# Patient Record
Sex: Male | Born: 1965 | Race: White | Hispanic: No | Marital: Married | Smoking: Never smoker
Health system: Southern US, Community
[De-identification: ages and names within clinical notes are randomized; demographics above are authoritative.]

## PROBLEM LIST (undated history)

## (undated) DIAGNOSIS — K5792 Diverticulitis of intestine, part unspecified, without perforation or abscess without bleeding: Secondary | ICD-10-CM

## (undated) HISTORY — DX: Diverticulitis of intestine, part unspecified, without perforation or abscess without bleeding: K57.92

---

## 2000-04-11 ENCOUNTER — Emergency Department (HOSPITAL_COMMUNITY): Admission: EM | Admit: 2000-04-11 | Discharge: 2000-04-11 | Payer: Self-pay | Admitting: Emergency Medicine

## 2007-04-15 ENCOUNTER — Encounter (HOSPITAL_COMMUNITY): Admission: RE | Admit: 2007-04-15 | Discharge: 2007-06-29 | Payer: Self-pay | Admitting: Endocrinology

## 2011-02-28 ENCOUNTER — Ambulatory Visit (INDEPENDENT_AMBULATORY_CARE_PROVIDER_SITE_OTHER): Payer: BC Managed Care – PPO

## 2011-02-28 DIAGNOSIS — J069 Acute upper respiratory infection, unspecified: Secondary | ICD-10-CM

## 2011-07-02 ENCOUNTER — Ambulatory Visit (INDEPENDENT_AMBULATORY_CARE_PROVIDER_SITE_OTHER): Payer: BC Managed Care – PPO | Admitting: Internal Medicine

## 2011-07-02 DIAGNOSIS — H9209 Otalgia, unspecified ear: Secondary | ICD-10-CM

## 2011-07-02 DIAGNOSIS — J329 Chronic sinusitis, unspecified: Secondary | ICD-10-CM

## 2011-07-02 MED ORDER — AMOXICILLIN 500 MG PO CAPS: 1000.0000 mg | ORAL_CAPSULE | Freq: Two times a day (BID) | ORAL | Status: AC

## 2011-07-02 MED ORDER — MELOXICAM 15 MG PO TABS: 15.0000 mg | ORAL_TABLET | Freq: Every day | ORAL | Status: DC

## 2011-07-02 MED ORDER — CIPROFLOXACIN-HYDROCORTISONE 0.2-1 % OT SUSP: 3.0000 [drp] | Freq: Two times a day (BID) | OTIC | Status: AC

## 2011-07-02 NOTE — Progress Notes (Signed)
  Subjective:    Patient ID: Joe Grant, male    DOB: [birth year], 46 y.o.   MRN: [id number]  HPIOtalgia for 5 days with no change in hearing Sinus congestion for 3 weeks with increased allergy symptoms No fever cough  Son is out of trouble and doing better but his wife has developed fibromyalgia following a severe auto accident 2 years ago and is not doing well at home  Review of Systems     Objective:   Physical Exam Left canal with pustule TMs intact Nares boggy appearance Tender maxillary areas to percussion No regional adenopathy       Assessment & Plan:  Problem #1 allergic rhinitis Problem #2 sinusitis Problem #3 otalgia secondary to otitis externa Meds ordered this encounter  Medications  . amoxicillin (AMOXIL) 500 MG capsule    Sig: Take 2 capsules (1,000 mg total) by mouth 2 (two) times daily.    Dispense:  40 capsule    Refill:  0  . meloxicam (MOBIC) 15 MG tablet    Sig: Take 1 tablet (15 mg total) by mouth daily. For pain    Dispense:  30 tablet    Refill:  0  . ciprofloxacin-hydrocortisone (CIPRO HC) otic suspension    Sig: Place 3 drops into the left ear 2 (two) times daily.    Dispense:  10 mL    Refill:  0   Check in 2 weeks if not well sooner if worse

## 2011-07-15 ENCOUNTER — Ambulatory Visit (INDEPENDENT_AMBULATORY_CARE_PROVIDER_SITE_OTHER): Payer: BC Managed Care – PPO | Admitting: Physician Assistant

## 2011-07-15 VITALS — BP 122/74 | HR 71 | Temp 98.1°F | Resp 16 | Ht 68.5 in | Wt 174.2 lb

## 2011-07-15 DIAGNOSIS — H6980 Other specified disorders of Eustachian tube, unspecified ear: Secondary | ICD-10-CM

## 2011-07-15 DIAGNOSIS — R51 Headache: Secondary | ICD-10-CM

## 2011-07-15 DIAGNOSIS — H9209 Otalgia, unspecified ear: Secondary | ICD-10-CM

## 2011-07-15 DIAGNOSIS — Z131 Encounter for screening for diabetes mellitus: Secondary | ICD-10-CM

## 2011-07-15 LAB — GLUCOSE, POCT (MANUAL RESULT ENTRY): POC Glucose: 107 mg/dl — AB (ref 70–99)

## 2011-07-15 MED ORDER — METHYLPREDNISOLONE ACETATE 80 MG/ML IJ SUSP
80.0000 mg | Freq: Once | INTRAMUSCULAR | Status: AC
  Administered 2011-07-15: 80 mg via INTRAMUSCULAR

## 2011-07-15 MED ORDER — NAPROXEN 500 MG PO TABS: ORAL_TABLET | ORAL | Status: DC

## 2011-07-15 MED ORDER — FLUTICASONE PROPIONATE 50 MCG/ACT NA SUSP: 2.0000 | Freq: Every day | NASAL | Status: DC

## 2011-07-15 NOTE — Progress Notes (Signed)
  Subjective:    Patient ID: Joe Grant, male    DOB: 1965/11/25, 46 y.o.   MRN: [id number]  HPI See last OV.  F/up s/p ear infection.  Didn't get ear drops, only took oral antibiotic.  Mobic only lasted about 1 hour for the headaches he is having.   His L ear continues to throb and be uncomfortable with reduced hearing. No f/c.  Review of Systems  All other systems reviewed and are negative.       Objective:   Physical Exam  Nursing note and vitals reviewed. Constitutional: He is oriented to person, place, and time. He appears well-developed and well-nourished.  HENT:  Head: Normocephalic and atraumatic.  Right Ear: External ear normal.  Left Ear: External ear normal.  Mouth/Throat: No oropharyngeal exudate (throat w/ PND).       L TM bulging w/o infection.  Scarring present at inferior aspect of TM.  Pinna non-tender w/ manipulation, no erythema in canal.  Turbinates pale and enlarged.  Neck: Normal range of motion. Neck supple.  Cardiovascular: Normal rate, regular rhythm and normal heart sounds.   Pulmonary/Chest: Effort normal and breath sounds normal.  Neurological: He is alert and oriented to person, place, and time.   Results for orders placed in visit on 07/15/11  GLUCOSE, POCT (MANUAL RESULT ENTRY)      Component Value Range   POC Glucose 107 (*) 70 - 99 (mg/dl)          Assessment & Plan:  Headache-secondary to sinus inflammation Eustachian tube dysfunction 80 IM Depo medrol now.  Start OTC sudafed. D/c mobic.  Try naprosyn and start flonase. Patient will call by the end of the week if not improving and will refer to ENT.

## 2011-07-15 NOTE — Patient Instructions (Signed)
Call by end of week if not improving at all and we will refer to ENT.

## 2011-07-23 ENCOUNTER — Ambulatory Visit (INDEPENDENT_AMBULATORY_CARE_PROVIDER_SITE_OTHER): Payer: BC Managed Care – PPO | Admitting: Family Medicine

## 2011-07-23 VITALS — BP 121/75 | HR 80 | Temp 97.9°F | Resp 16 | Ht 68.5 in | Wt 168.6 lb

## 2011-07-23 DIAGNOSIS — H9209 Otalgia, unspecified ear: Secondary | ICD-10-CM

## 2011-07-23 LAB — POCT CBC
Granulocyte percent: 55.7 %G (ref 37–80)
HCT, POC: 44.4 % (ref 43.5–53.7)
MCV: 95.1 fL (ref 80–97)
MID (cbc): 0.6 (ref 0–0.9)
Platelet Count, POC: 223 10*3/uL (ref 142–424)
RBC: 4.67 M/uL — AB (ref 4.69–6.13)

## 2011-07-23 MED ORDER — PREDNISONE 20 MG PO TABS: ORAL_TABLET | ORAL | Status: AC

## 2011-07-23 NOTE — Progress Notes (Signed)
Subjective: Painful ear continues to bother him. Improved a little for a day or 2 after the steroid shot but it has gotten worse. It continues to bother him quite a lot. Feels like he hurts into the left side of the face. He feels like it's swollen jaw.  Objective: TM looks normal ear canal looks normal there is a little old scarring at the base of the left TM but no active infection. The TMJ is not particularly tender. The pinna does not hurt on movement..  Assessment: Otalgia  Plan: Refer to ENT  Specialist Prednisone taper

## 2011-07-23 NOTE — Patient Instructions (Signed)
Take medicine as ordered  We are referring you to the Ear Specialist

## 2011-07-24 ENCOUNTER — Emergency Department (HOSPITAL_COMMUNITY)
Admission: EM | Admit: 2011-07-24 | Discharge: 2011-07-24 | Disposition: A | Discharge status: Home / Self Care | Payer: BC Managed Care – PPO | Attending: Emergency Medicine | Admitting: Emergency Medicine

## 2011-07-24 ENCOUNTER — Encounter (HOSPITAL_COMMUNITY): Payer: Self-pay | Admitting: *Deleted

## 2011-07-24 DIAGNOSIS — R51 Headache: Secondary | ICD-10-CM | POA: Insufficient documentation

## 2011-07-24 DIAGNOSIS — H9209 Otalgia, unspecified ear: Secondary | ICD-10-CM

## 2011-07-24 DIAGNOSIS — R519 Headache, unspecified: Secondary | ICD-10-CM

## 2011-07-24 MED ORDER — HYDROCODONE-ACETAMINOPHEN 5-500 MG PO TABS: 1.0000 | ORAL_TABLET | Freq: Four times a day (QID) | ORAL | Status: AC | PRN

## 2011-07-24 MED ORDER — GABAPENTIN 300 MG PO CAPS: ORAL_CAPSULE | ORAL | Status: DC

## 2011-07-24 MED ORDER — HYDROCODONE-ACETAMINOPHEN 5-325 MG PO TABS
2.0000 | ORAL_TABLET | Freq: Once | ORAL | Status: AC
  Administered 2011-07-24: 1 via ORAL
  Filled 2011-07-24: qty 2

## 2011-07-24 NOTE — Discharge Instructions (Signed)
Start neurontin as directed. Use hydrocodone-acetaminophen as needed for pain but do not drive or operate machinery with hydrocodone-acetaminophen use. Follow up with ENT is VERY important for further evaluation and management of ongoing pain but return to ER for emergent changing or worsening of symptoms.

## 2011-07-24 NOTE — ED Notes (Signed)
Pt c/o left ear infection x 1 month; finished antibiotics/steroid inj/steroid pills.  States pain severe; MD is referring him to ENT

## 2011-07-24 NOTE — ED Provider Notes (Signed)
History     CSN: [id number]  Arrival date & time 07/24/11  0555   First MD Initiated Contact with Patient 07/24/11 [phone]      Chief Complaint  Patient presents with  . Otalgia    (Consider location/radiation/quality/duration/timing/severity/associated sxs/prior treatment) HPI  Patient presents to ER complaining of a one month hx of left ear pain with radiation of pain into left temple and jaw that he states he has been seeing his PCP Dr. Alwyn Ren for stating that over the last month has been on abx and steriods stating that initially he was told he had an ear infection. Patient states that despite abx, steroids and 600mg  of ibuprofen he is having ongoing severe but unchanging pain. PCP made appt with ENT for the near future. He denies aggravating or alleviating factors. Patient denies fever, chills, dizziness, visual changes, neck pain, dental pain, sore throat, neck stiffness.  History reviewed. No pertinent past medical history.  History reviewed. No pertinent past surgical history.  History reviewed. No pertinent family history.  History  Substance Use Topics  . Smoking status: Never Smoker   . Smokeless tobacco: Not on file  . Alcohol Use: Not on file      Review of Systems  All other systems reviewed and are negative.    Allergies  Review of patient's allergies indicates no known allergies.  Home Medications   Current Outpatient Rx  Name Route Sig Dispense Refill  . FLUTICASONE PROPIONATE 50 MCG/ACT NA SUSP Nasal Place 2 sprays into the nose daily. 16 g 6  . NAPROXEN 500 MG PO TABS  1 tab bid w/ food X 5 days then prn pain or headache 60 tablet 0  . PREDNISONE 20 MG PO TABS  Take 3 daily for 2 days, 2 daily for 2 day, 1 daily for 2 days, 1/2 daily for 2 days 13 tablet 0    BP 145/85  Pulse 92  Temp 98.3 F (36.8 C) (Oral)  Resp 20  Wt 168 lb (76.204 kg)  SpO2 97%  Physical Exam  Vitals reviewed. Constitutional: He is oriented to person, place, and time.  He appears well-developed and well-nourished. No distress.  HENT:  Head: Normocephalic and atraumatic.  Right Ear: External ear normal.  Left Ear: External ear normal.  Nose: Nose normal.  Mouth/Throat: No oropharyngeal exudate.       Mild erythema of posterior pharynx and tonsils no tonsillar exudate or enlargement. Patent airway. Swallowing secretions well  Mild TTP of left TMJ but no TTP of post auricular region with no abnormalities of external ears bilaterally.   No temporal artery TTP and no ropiness.   Eyes: Conjunctivae and EOM are normal. Pupils are equal, round, and reactive to light.  Neck: Normal range of motion. Neck supple.  Cardiovascular: Normal rate, regular rhythm and normal heart sounds.  Exam reveals no gallop and no friction rub.   No murmur heard. Pulmonary/Chest: Effort normal and breath sounds normal. No respiratory distress. He has no wheezes. He has no rales. He exhibits no tenderness.  Lymphadenopathy:    He has no cervical adenopathy.  Neurological: He is alert and oriented to person, place, and time. He has normal reflexes. No cranial nerve deficit. Coordination normal.  Skin: Skin is warm and dry. No rash noted. He is not diaphoretic.  Psychiatric: He has a normal mood and affect.    ED Course  Procedures (including critical care time)  PO norco  Labs Reviewed - No data to display No  results found.   1. Earache   2. Facial pain       MDM  Normal ear exam of internal and external ear. No worrisome signs or symptoms for temporal arteritis with patient's age making such dx unlikely. No neuro focal findings with a one month hx of unchanging pain and patient afebrile and non toxic appearing. Question possible neuralgia such as trigeminal neuralgia causing pain into left ear and face. Will start neurontin with norco for pain but patient has established f/u with ENT.       [place], [place] 07/24/11 0730

## 2011-07-24 NOTE — ED Provider Notes (Signed)
Medical screening examination/treatment/procedure(s) were performed by non-physician practitioner and as supervising physician I was immediately available for consultation/collaboration.   Celene Kras, MD 07/24/11 [phone]

## 2011-07-31 ENCOUNTER — Telehealth: Payer: Self-pay

## 2011-07-31 NOTE — Telephone Encounter (Signed)
When did he see the ENT?  Who did he see?  When is his appointment with the dentist?

## 2011-07-31 NOTE — Telephone Encounter (Signed)
Pt went to ent - they referred him to dentist.  He will run out of pain meds soon and would like more until he can get to the dentist.  Best number is [phone]

## 2011-08-01 NOTE — Telephone Encounter (Signed)
Spoke with Joe Grant. He saw the ENT two days ago. He saw Dr. Pollyann Kennedy and his appt was today so got pain meds from his doctor. He wanted to let Dr Alwyn Ren know he was diagnosed with TMJ

## 2011-08-02 NOTE — Telephone Encounter (Signed)
Noted  

## 2011-08-12 ENCOUNTER — Telehealth: Payer: Self-pay

## 2011-08-12 NOTE — Telephone Encounter (Signed)
Patient is requesting a refill of Vicodin to get him until Monday. This is for jaw pain. He saw a dentist last week and they gave him enough until today, but is seeing an oral surgeon Monday. Please advise, [phone]

## 2011-08-13 NOTE — Telephone Encounter (Signed)
Explained to pt that he should speak w/his dentist who Rxd his pain med through today and ask for him to extend it until his appt w/oral surgeon. Pt agreed.

## 2011-08-13 NOTE — Telephone Encounter (Signed)
Needs to call dentist

## 2011-09-24 ENCOUNTER — Encounter: Payer: Self-pay | Admitting: Family Medicine

## 2012-08-16 ENCOUNTER — Ambulatory Visit (INDEPENDENT_AMBULATORY_CARE_PROVIDER_SITE_OTHER): Payer: BC Managed Care – PPO | Admitting: Family Medicine

## 2012-08-16 VITALS — BP 122/78 | HR 92 | Temp 97.5°F | Resp 18 | Ht 70.0 in | Wt 181.0 lb

## 2012-08-16 DIAGNOSIS — K5732 Diverticulitis of large intestine without perforation or abscess without bleeding: Secondary | ICD-10-CM

## 2012-08-16 MED ORDER — METRONIDAZOLE 500 MG PO TABS: 500.0000 mg | ORAL_TABLET | Freq: Two times a day (BID) | ORAL | Status: DC

## 2012-08-16 MED ORDER — CIPROFLOXACIN HCL 500 MG PO TABS: 500.0000 mg | ORAL_TABLET | Freq: Two times a day (BID) | ORAL | Status: DC

## 2012-08-16 NOTE — Progress Notes (Signed)
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  He presents with LLQ pain x 24 hours.    Significant negs:  No nausea, diarrhea, constipation, fever  Objective:  NAD HEENT:  Unremarkable Neck: supple, mild soft tissue fullness over thyroid area Heart:  I/VI SEM, regular Chest:  Clear Abdomen:  Soft, no HSM, some guarding with LLQ tenderness, no rebound, no mass Skin:  No rash  Assessment:  Diverticulitis.  Plan:   Diverticulitis of colon without hemorrhage - Plan: ciprofloxacin (CIPRO) 500 MG tablet, metroNIDAZOLE (FLAGYL) 500 MG tablet  Signed, Elvina Sidle, MD

## 2012-08-16 NOTE — Patient Instructions (Addendum)
Diverticulitis °A diverticulum is a small pouch or sac on the colon. Diverticulosis is the presence of these diverticula on the colon. Diverticulitis is the irritation (inflammation) or infection of diverticula. °CAUSES  °The colon and its diverticula contain bacteria. If food particles block the tiny opening to a diverticulum, the bacteria inside can grow and cause an increase in pressure. This leads to infection and inflammation and is called diverticulitis. °SYMPTOMS  °· Abdominal pain and tenderness. Usually, the pain is located on the left side of your abdomen. However, it could be located elsewhere. °· Fever. °· Bloating. °· Feeling sick to your stomach (nausea). °· Throwing up (vomiting). °· Abnormal stools. °DIAGNOSIS  °Your caregiver will take a history and perform a physical exam. Since many things can cause abdominal pain, other tests may be necessary. Tests may include: °· Blood tests. °· Urine tests. °· X-ray of the abdomen. °· CT scan of the abdomen. °Sometimes, surgery is needed to determine if diverticulitis or other conditions are causing your symptoms. °TREATMENT  °Most of the time, you can be treated without surgery. Treatment includes: °· Resting the bowels by only having liquids for a few days. As you improve, you will need to eat a low-fiber diet. °· Intravenous (IV) fluids if you are losing body fluids (dehydrated). °· Antibiotic medicines that treat infections may be given. °· Pain and nausea medicine, if needed. °· Surgery if the inflamed diverticulum has burst. °HOME CARE INSTRUCTIONS  °· Try a clear liquid diet (broth, tea, or water for as long as directed by your caregiver). You may then gradually begin a low-fiber diet as tolerated.  °A low-fiber diet is a diet with less than 10 grams of fiber. Choose the foods below to reduce fiber in the diet: °· White breads, cereals, rice, and pasta. °· Cooked fruits and vegetables or soft fresh fruits and vegetables without the skin. °· Ground or  well-cooked tender beef, ham, veal, lamb, pork, or poultry. °· Eggs and seafood. °· After your diverticulitis symptoms have improved, your caregiver may put you on a high-fiber diet. A high-fiber diet includes 14 grams of fiber for every 1000 calories consumed. For a standard 2000 calorie diet, you would need 28 grams of fiber. Follow these diet guidelines to help you increase the fiber in your diet. It is important to slowly increase the amount fiber in your diet to avoid gas, constipation, and bloating. °· Choose whole-grain breads, cereals, pasta, and brown rice. °· Choose fresh fruits and vegetables with the skin on. Do not overcook vegetables because the more vegetables are cooked, the more fiber is lost. °· Choose more nuts, seeds, legumes, dried peas, beans, and lentils. °· Look for food products that have greater than 3 grams of fiber per serving on the Nutrition Facts label. °· Take all medicine as directed by your caregiver. °· If your caregiver has given you a follow-up appointment, it is very important that you go. Not going could result in lasting (chronic) or permanent injury, pain, and disability. If there is any problem keeping the appointment, call to reschedule. °SEEK MEDICAL CARE IF:  °· Your pain does not improve. °· You have a hard time advancing your diet beyond clear liquids. °· Your bowel movements do not return to normal. °SEEK IMMEDIATE MEDICAL CARE IF:  °· Your pain becomes worse. °· You have an oral temperature above 102° F (38.9° C), not controlled by medicine. °· You have repeated vomiting. °· You have bloody or black, tarry stools. °·   Symptoms that brought you to your caregiver become worse or are not getting better. °MAKE SURE YOU:  °· Understand these instructions. °· Will watch your condition. °· Will get help right away if you are not doing well or get worse. °Document Released: 11/07/2004 Document Revised: 04/22/2011 Document Reviewed: 03/05/2010 °[employer]® Patient Information  ©2014 [employer]. ° °

## 2012-08-26 ENCOUNTER — Ambulatory Visit (INDEPENDENT_AMBULATORY_CARE_PROVIDER_SITE_OTHER): Payer: BC Managed Care – PPO | Admitting: Family Medicine

## 2012-08-26 VITALS — BP 110/68 | HR 66 | Temp 97.8°F | Resp 16 | Ht 68.0 in | Wt 180.0 lb

## 2012-08-26 DIAGNOSIS — R1032 Left lower quadrant pain: Secondary | ICD-10-CM

## 2012-08-26 LAB — POCT CBC
Granulocyte percent: 40.9 %G (ref 37–80)
HCT, POC: 45.9 % (ref 43.5–53.7)
Hemoglobin: 14.7 g/dL (ref 14.1–18.1)
Lymph, poc: 3 (ref 0.6–3.4)
MCH, POC: 31.4 pg — AB (ref 27–31.2)
MCHC: 32 g/dL (ref 31.8–35.4)
MCV: 98 fL — AB (ref 80–97)
MID (cbc): 0.6 (ref 0–0.9)
MPV: 9.9 fL (ref 0–99.8)
POC Granulocyte: 2.5 (ref 2–6.9)
POC LYMPH PERCENT: 49.5 %L (ref 10–50)
POC MID %: 9.6 %M (ref 0–12)
Platelet Count, POC: 245 10*3/uL (ref 142–424)
RBC: 4.68 M/uL — AB (ref 4.69–6.13)
RDW, POC: 13.4 %
WBC: 6 10*3/uL (ref 4.6–10.2)

## 2012-08-26 NOTE — Progress Notes (Signed)
Patient who works for [employer], developed LLQ pain 11 days ago and took 10 days of antibiotics (Flagyl and Cipro) with only small improvement.  He has also noted some blood in stool. Patient has a grandmother with h/o diverticulitis  Objective:  NAD HEENT:  Unremarkable Skin: normal Abdomen:  Soft, tender LLQ with fullness there, no HSM. Gait:  Normal  Assessment:  Persistent LLQ which  May represent a polyp complicating diverticulosis  Plan: Gastroenterolgy consult LLQ abdominal pain - Plan: Comprehensive metabolic panel, POCT CBC, Ambulatory referral to Gastroenterology  Signed, Elvina Sidle, MD

## 2012-08-27 LAB — COMPREHENSIVE METABOLIC PANEL
ALT: 54 U/L — ABNORMAL HIGH (ref 0–53)
AST: 32 U/L (ref 0–37)
Albumin: 4.1 g/dL (ref 3.5–5.2)
Alkaline Phosphatase: 57 U/L (ref 39–117)
BUN: 18 mg/dL (ref 6–23)
CO2: 28 mEq/L (ref 19–32)
Calcium: 9.3 mg/dL (ref 8.4–10.5)
Chloride: 107 mEq/L (ref 96–112)
Creat: 0.96 mg/dL (ref 0.50–1.35)
Glucose, Bld: 95 mg/dL (ref 70–99)
Potassium: 4.3 mEq/L (ref 3.5–5.3)
Sodium: 140 mEq/L (ref 135–145)
Total Bilirubin: 0.3 mg/dL (ref 0.3–1.2)
Total Protein: 7.1 g/dL (ref 6.0–8.3)

## 2012-09-01 ENCOUNTER — Encounter: Payer: Self-pay | Admitting: Gastroenterology

## 2012-09-03 ENCOUNTER — Other Ambulatory Visit: Payer: Self-pay | Admitting: Gastroenterology

## 2012-09-03 ENCOUNTER — Ambulatory Visit
Admission: RE | Admit: 2012-09-03 | Discharge: 2012-09-03 | Disposition: A | Discharge status: Home / Self Care | Payer: BC Managed Care – PPO | Source: Ambulatory Visit | Attending: Gastroenterology | Admitting: Gastroenterology

## 2012-09-03 DIAGNOSIS — R1032 Left lower quadrant pain: Secondary | ICD-10-CM

## 2012-09-03 MED ORDER — IOHEXOL 300 MG/ML  SOLN
100.0000 mL | Freq: Once | INTRAMUSCULAR | Status: AC | PRN
  Administered 2012-09-03: 100 mL via INTRAVENOUS

## 2012-09-23 ENCOUNTER — Ambulatory Visit: Payer: BC Managed Care – PPO | Admitting: Gastroenterology

## 2013-01-25 ENCOUNTER — Ambulatory Visit (INDEPENDENT_AMBULATORY_CARE_PROVIDER_SITE_OTHER): Payer: BC Managed Care – PPO | Admitting: Internal Medicine

## 2013-01-25 VITALS — BP 128/88 | HR 79 | Temp 98.0°F | Resp 16 | Ht 69.0 in | Wt 180.0 lb

## 2013-01-25 DIAGNOSIS — R05 Cough: Secondary | ICD-10-CM

## 2013-01-25 DIAGNOSIS — J019 Acute sinusitis, unspecified: Secondary | ICD-10-CM

## 2013-01-25 DIAGNOSIS — J9801 Acute bronchospasm: Secondary | ICD-10-CM

## 2013-01-25 MED ORDER — PROMETHAZINE-DM 6.25-15 MG/5ML PO SYRP: 5.0000 mL | ORAL_SOLUTION | Freq: Four times a day (QID) | ORAL | Status: DC | PRN

## 2013-01-25 MED ORDER — AMOXICILLIN 500 MG PO CAPS: 1000.0000 mg | ORAL_CAPSULE | Freq: Two times a day (BID) | ORAL | Status: AC

## 2013-01-25 MED ORDER — PREDNISONE 20 MG PO TABS: ORAL_TABLET | ORAL | Status: DC

## 2013-01-25 NOTE — Progress Notes (Addendum)
  This chart was scribed for Ellamae Sia, MD by Luisa Dago, ED Scribe. This patient was seen in room 10  and the patient's care was started at 6:45pm.  Subjective:    Patient ID: Joe Grant, male    DOB: 02/11/1966, 47 y.o.   MRN: [id number] Chief Complaint  Patient presents with  . Cough    1 week   . Generalized Body Aches    Cough Associated symptoms include headaches, myalgias (Generalized body aches) and a sore throat. Pertinent negatives include no fever.   HPI Comments: Joe Grant is a 47 y.o. male who presents to the Emergency Department complaining of a cough that started 1 week ago. Pt also complains of associated generalized myalgias, sore throat and headache. Pt states that he started taking Mucinex 1 week ago and it relieved his congestion, but it did not help with his cough. He denies having a fever.     Review of Systems  Constitutional: Negative for fever.  HENT: Positive for sore throat.   Respiratory: Positive for cough.   Musculoskeletal: Positive for myalgias (Generalized body aches).  Neurological: Positive for headaches.       Objective:   Physical Exam  HENT:  Right Ear: Tympanic membrane, external ear and ear canal normal.  Left Ear: Tympanic membrane, external ear and ear canal normal.  Nose has purulent discharge. Throat slightly injected.  Eyes: Conjunctivae and EOM are normal. Pupils are equal, round, and reactive to light.  Pulmonary/Chest: He has no wheezes.   hoarseness      Assessment & Plan:  Acute sinusitis, unspecified  Cough  Meds ordered this encounter  Medications  . amoxicillin (AMOXIL) 500 MG capsule    Sig: Take 2 capsules (1,000 mg total) by mouth 2 (two) times daily.    Dispense:  40 capsule    Refill:  0  . promethazine-dextromethorphan (PROMETHAZINE-DM) 6.25-15 MG/5ML syrup    Sig: Take 5 mLs by mouth 4 (four) times daily as needed for cough.    Dispense:  118 mL    Refill:  0  . predniSONE  (DELTASONE) 20 MG tablet    Sig: 3/3/2/2/1/1 single daily dose 6 days    Dispense:  12 tablet    Refill:  0

## 2013-10-11 ENCOUNTER — Ambulatory Visit (INDEPENDENT_AMBULATORY_CARE_PROVIDER_SITE_OTHER): Payer: BC Managed Care – PPO | Admitting: Family Medicine

## 2013-10-11 VITALS — BP 128/88 | HR 76 | Temp 98.0°F | Resp 16 | Ht 68.5 in | Wt 183.8 lb

## 2013-10-11 DIAGNOSIS — R0982 Postnasal drip: Secondary | ICD-10-CM

## 2013-10-11 DIAGNOSIS — R059 Cough, unspecified: Secondary | ICD-10-CM

## 2013-10-11 DIAGNOSIS — R05 Cough: Secondary | ICD-10-CM

## 2013-10-11 DIAGNOSIS — J029 Acute pharyngitis, unspecified: Secondary | ICD-10-CM

## 2013-10-11 LAB — POCT RAPID STREP A (OFFICE): Rapid Strep A Screen: NEGATIVE

## 2013-10-11 MED ORDER — IPRATROPIUM BROMIDE 0.03 % NA SOLN: 2.0000 | Freq: Four times a day (QID) | NASAL | Status: DC

## 2013-10-11 MED ORDER — PREDNISONE 20 MG PO TABS: ORAL_TABLET | ORAL | Status: DC

## 2013-10-11 NOTE — Patient Instructions (Signed)
Use the atrovent nasal as needed for drainage, and the prednisone as directed.  Let me know if you do not feel better in the next couple of days- Sooner if worse.

## 2013-10-11 NOTE — Progress Notes (Signed)
[facility] [address] [place] [address] [phone]  Date:  10/11/2013   Name:  Joe Grant   DOB:  Nov 30, 1965   MRN:  [id number]  PCP:  No PCP Per Patient    Chief Complaint: Fatigue, Sore Throat, Cough and Headache   History of Present Illness:  Joe Grant is a 48 y.o. very pleasant male patient who presents with the following:  He has been ill for 4 days or so with hoarse voice, nasal congestion, PND, ST, and cough.  He has noted aches and fatigue.  He is not aware of any fever.  At night he coughs more.   No sick contacts at home.  He notes "this normally comes on change of weather."  He did have a little loose stool yesterday.  He has used nyquil and dayquil He is outdoors a good bit- he has noted a few ticks but has not pulled any off.  He has had RMSF in the past and this does NOT seems like RMSF to him. No rash  There are no active problems to display for this patient.   Past Medical History  Diagnosis Date  . Diverticulitis     History reviewed. No pertinent past surgical history.  History  Substance Use Topics  . Smoking status: Never Smoker   . Smokeless tobacco: Not on file  . Alcohol Use: Yes    History reviewed. No pertinent family history.  No Known Allergies  Medication list has been reviewed and updated.  Current Outpatient Prescriptions on File Prior to Visit  Medication Sig Dispense Refill  . fluticasone (FLONASE) 50 MCG/ACT nasal spray Place 2 sprays into the nose daily.  16 g  6  . gabapentin (NEURONTIN) 300 MG capsule 1 PO on day one, 1 PO BID on day two, 1 PO TID on day 3 and there after.  60 capsule  0   No current facility-administered medications on file prior to visit.    Review of Systems:  As per HPI- otherwise negative.   Physical Examination: Filed Vitals:   10/11/13 1504  BP: 128/88  Pulse: 76  Temp: 98 F (36.7 C)  Resp: 16   Filed Vitals:   10/11/13 1504  Height: 5' 8.5"  (1.74 m)  Weight: 183 lb 12.8 oz (83.371 kg)   Body mass index is 27.54 kg/(m^2). Ideal Body Weight: Weight in (lb) to have BMI = 25: 166.5  GEN: WDWN, NAD, Non-toxic, A & O x 3, looks well HEENT: Atraumatic, Normocephalic. Neck supple. No masses, No LAD.  Bilateral TM wnl, oropharynx normal.  PEERL,EOMI.  Nasal cavity congestion Ears and Nose: No external deformity. CV: RRR, No M/G/R. No JVD. No thrill. No extra heart sounds. PULM: CTA B, no wheezes, crackles, rhonchi. No retractions. No resp. distress. No accessory muscle use. EXTR: No c/c/e NEURO Normal gait.  PSYCH: Normally interactive. Conversant. Not depressed or anxious appearing.  Calm demeanor.   Results for orders placed in visit on 10/11/13  POCT RAPID STREP A (OFFICE)      Result Value Ref Range   Rapid Strep A Screen Negative  Negative     Assessment and Plan: PND (post-nasal drip) - Plan: ipratropium (ATROVENT) 0.03 % nasal spray, predniSONE (DELTASONE) 20 MG tablet  Acute pharyngitis, unspecified pharyngitis type - Plan: POCT rapid strep A  Cough - Plan: predniSONE (DELTASONE) 20 MG tablet  Likely allergic symptoms leading to PND, sinus congestion and cough.  Will treat  with prednisone and atrovent nasal, he will let me know if not better in the next few days  Signed Abbe Amsterdam, MD

## 2014-01-19 ENCOUNTER — Ambulatory Visit (INDEPENDENT_AMBULATORY_CARE_PROVIDER_SITE_OTHER): Payer: BC Managed Care – PPO | Admitting: Family Medicine

## 2014-01-19 VITALS — BP 122/77 | HR 71 | Temp 97.6°F | Resp 18 | Wt 185.0 lb

## 2014-01-19 DIAGNOSIS — R1032 Left lower quadrant pain: Secondary | ICD-10-CM

## 2014-01-19 MED ORDER — AMOXICILLIN-POT CLAVULANATE 875-125 MG PO TABS
1.0000 | ORAL_TABLET | Freq: Two times a day (BID) | ORAL | Status: DC
Start: 2014-01-19 — End: 2014-01-29

## 2014-01-19 NOTE — Progress Notes (Signed)
This chart was scribed for Elvina SidleKurt Kerissa Coia, MD by Tonye RoyaltyJoshua Chen, ED Scribe. This patient was seen in room 2.      Patient ID: Joe Grant MRN: [id number], DOB: [birth year], 48 y.o. Date of Encounter: 01/19/2014, 6:32 PM  Primary Physician: No PCP Per Patient  Chief Complaint: abdominal pain  HPI: 48 y.o. year old male with history below significant for diverticulitis presents with waxing and waning LLQ abdominal pain with onset 2 weeks ago, pain is worse with movement. He reports intermittent appetite loss. He states he sometimes feels bloated but it resolves with bowel movement. He notes pain seems to be located a little differently from prior diverticulitis flare ups. He states he has had diverticulitis since 1 year ago and has been using probiotics. He states he does not eat popcorn; he reports noticing problems after eating blueberries. He denies fever, bowel abnormalities, or urinary symptoms.   Past Medical History  Diagnosis Date  . Diverticulitis      Home Meds: Prior to Admission medications   Medication Sig Start Date End Date Taking? Authorizing Provider  Probiotic Product (PROBIOTIC DAILY PO) Take by mouth.   Yes Historical Provider, MD    Allergies: No Known Allergies  History   Social History  . Marital Status: Married    Spouse Name: N/A    Number of Children: N/A  . Years of Education: N/A   Occupational History  . Not on file.   Social History Main Topics  . Smoking status: Never Smoker   . Smokeless tobacco: Not on file  . Alcohol Use: Yes  . Drug Use: No  . Sexual Activity: Not on file   Other Topics Concern  . Not on file   Social History Narrative     Review of Systems: Constitutional: negative for chills, fever, night sweats, weight changes, or fatigue, positive for appetite change HEENT: negative for vision changes, hearing loss, congestion, rhinorrhea, ST, epistaxis, or sinus pressure Cardiovascular: negative for chest pain or  palpitations Respiratory: negative for hemoptysis, wheezing, shortness of breath, or cough Abdominal: negative for nausea, vomiting, diarrhea, or constipation, positive for abdominal pain, bloating Dermatological: negative for rash Neurologic: negative for headache, dizziness, or syncope All other systems reviewed and are otherwise negative with the exception to those above and in the HPI.   Physical Exam: Blood pressure 122/77, pulse 71, temperature 97.6 F (36.4 C), temperature source Oral, resp. rate 18, weight 185 lb (83.915 kg), SpO2 97 %., Body mass index is 27.72 kg/(m^2). General: Well developed, well nourished, in no acute distress. Head: Normocephalic, atraumatic, eyes without discharge, sclera non-icteric, nares are without discharge. Bilateral auditory canals clear, TM's are without perforation, pearly grey and translucent with reflective cone of light bilaterally. Oral cavity moist, posterior pharynx without exudate, erythema, peritonsillar abscess, or post nasal drip.  Neck: Supple. No thyromegaly. Full ROM. No lymphadenopathy. Lungs: Clear bilaterally to auscultation without wheezes, rales, or rhonchi. Breathing is unlabored. Heart: RRR with S1 S2. No murmurs, rubs, or gallops appreciated. Abdomen: Soft, non-tender, non-distended with normoactive bowel sounds. No hepatomegaly. No rebound/guarding. No obvious abdominal masses. Msk:  Strength and tone normal for age. Extremities/Skin: Warm and dry. No clubbing or cyanosis. No edema. No rashes or suspicious lesions. Neuro: Alert and oriented X 3. Moves all extremities spontaneously. Gait is normal. CNII-XII grossly in tact. Psych:  Responds to questions appropriately with a normal affect.     ASSESSMENT AND PLAN:  48 y.o. year old male with  This chart was  scribed in my presence and reviewed by me personally.    ICD-9-CM ICD-10-CM   1. LLQ pain 789.04 R10.32    Augmentin 875 mg bid x 10 days  Signed, Elvina SidleKurt Lexie Morini,  MD    Signed, Elvina SidleKurt Tacy Chavis, MD 01/19/2014 6:32 PM

## 2014-01-29 ENCOUNTER — Ambulatory Visit (INDEPENDENT_AMBULATORY_CARE_PROVIDER_SITE_OTHER): Payer: BC Managed Care – PPO | Admitting: Family Medicine

## 2014-01-29 VITALS — BP 124/84 | HR 79 | Temp 98.2°F | Resp 16 | Ht 68.0 in | Wt 187.2 lb

## 2014-01-29 DIAGNOSIS — R109 Unspecified abdominal pain: Secondary | ICD-10-CM

## 2014-01-29 DIAGNOSIS — Z8719 Personal history of other diseases of the digestive system: Secondary | ICD-10-CM

## 2014-01-29 DIAGNOSIS — R10814 Left lower quadrant abdominal tenderness: Secondary | ICD-10-CM

## 2014-01-29 LAB — POCT URINALYSIS DIPSTICK
Bilirubin, UA: NEGATIVE
Glucose, UA: NEGATIVE
Ketones, UA: NEGATIVE
Leukocytes, UA: NEGATIVE
Nitrite, UA: NEGATIVE
Protein, UA: NEGATIVE
Spec Grav, UA: 1.015
Urobilinogen, UA: 0.2
pH, UA: 7

## 2014-01-29 LAB — POCT CBC
Granulocyte percent: 53.1 %G (ref 37–80)
HCT, POC: 47.4 % (ref 43.5–53.7)
Hemoglobin: 15.7 g/dL (ref 14.1–18.1)
Lymph, poc: 3.6 — AB (ref 0.6–3.4)
MCH, POC: 31.8 pg — AB (ref 27–31.2)
MCHC: 33.1 g/dL (ref 31.8–35.4)
MCV: 95.7 fL (ref 80–97)
MID (cbc): 0.4 (ref 0–0.9)
MPV: 8.1 fL (ref 0–99.8)
POC Granulocyte: 4.5 (ref 2–6.9)
POC LYMPH PERCENT: 41.8 %L (ref 10–50)
POC MID %: 5.1 %M (ref 0–12)
Platelet Count, POC: 205 10*3/uL (ref 142–424)
RBC: 4.94 M/uL (ref 4.69–6.13)
RDW, POC: 12.7 %
WBC: 8.5 10*3/uL (ref 4.6–10.2)

## 2014-01-29 LAB — POCT UA - MICROSCOPIC ONLY
Bacteria, U Microscopic: NEGATIVE
Casts, Ur, LPF, POC: NEGATIVE
Crystals, Ur, HPF, POC: NEGATIVE
Mucus, UA: NEGATIVE
WBC, Ur, HPF, POC: NEGATIVE
Yeast, UA: NEGATIVE

## 2014-01-29 MED ORDER — CIPROFLOXACIN HCL 500 MG PO TABS: 500.0000 mg | ORAL_TABLET | Freq: Two times a day (BID) | ORAL | Status: DC

## 2014-01-29 MED ORDER — METRONIDAZOLE 500 MG PO TABS: 500.0000 mg | ORAL_TABLET | Freq: Three times a day (TID) | ORAL | Status: DC

## 2014-01-29 NOTE — Patient Instructions (Signed)
Stop the Augmentin, start the new antibiotics one of each twice a day or as they can be taken together. The medial if you're not better in 48 hours

## 2014-01-29 NOTE — Progress Notes (Signed)
This is a 48 year old gentleman was seen recently with diverticulitis. He was started on Augmentin but really hasn't improved. Patient works for the [employer].  Patient has a history of CT scan proven diverticulitis earlier this year. He's having no blood in his stool but he is having more frequent urination lately.  Objective: No acute distress Chest: Clear Heart: Regular no murmur Abdomen: Mildly tender in the left lower quadrant without guarding or rebound. There is no HSM or masses.  Results for orders placed or performed in visit on 01/29/14  POCT CBC  Result Value Ref Range   WBC 8.5 4.6 - 10.2 K/uL   Lymph, poc 3.6 (A) 0.6 - 3.4   POC LYMPH PERCENT 41.8 10 - 50 %L   MID (cbc) 0.4 0 - 0.9   POC MID % 5.1 0 - 12 %M   POC Granulocyte 4.5 2 - 6.9   Granulocyte percent 53.1 37 - 80 %G   RBC 4.94 4.69 - 6.13 M/uL   Hemoglobin 15.7 14.1 - 18.1 g/dL   HCT, POC 16.147.4 09.643.5 - 53.7 %   MCV 95.7 80 - 97 fL   MCH, POC 31.8 (A) 27 - 31.2 pg   MCHC 33.1 31.8 - 35.4 g/dL   RDW, POC 04.512.7 %   Platelet Count, POC 205 142 - 424 K/uL   MPV 8.1 0 - 99.8 fL  POCT UA - Microscopic Only  Result Value Ref Range   WBC, Ur, HPF, POC NEG    RBC, urine, microscopic 0-3    Bacteria, U Microscopic NEG    Mucus, UA NEG    Epithelial cells, urine per micros 0-1    Crystals, Ur, HPF, POC NEG    Casts, Ur, LPF, POC NEG    Yeast, UA NEG   POCT urinalysis dipstick  Result Value Ref Range   Color, UA AMBER    Clarity, UA CLEAR    Glucose, UA NEG    Bilirubin, UA NEG    Ketones, UA NEG    Spec Grav, UA 1.015    Blood, UA TRACE-INTACT    pH, UA 7.0    Protein, UA NEG    Urobilinogen, UA 0.2    Nitrite, UA NEG    Leukocytes, UA Negative    Assessment: Patient's symptoms are most consistent with persistent diverticulitis does not respond to the Augmentin.  Plan:  This chart was scribed in my presence and reviewed by me personally.    ICD-9-CM ICD-10-CM   1. Left flank pain  789.09 R10.9 POCT CBC     POCT UA - Microscopic Only     POCT urinalysis dipstick  2. LLQ abdominal tenderness 789.64 R10.814 POCT CBC     POCT UA - Microscopic Only     POCT urinalysis dipstick     ciprofloxacin (CIPRO) 500 MG tablet     metroNIDAZOLE (FLAGYL) 500 MG tablet  3. Hx of diverticulitis of colon V12.79 Z87.19 POCT CBC     POCT UA - Microscopic Only     POCT urinalysis dipstick     Signed, Elvina SidleKurt Rozalyn Osland, MD

## 2014-02-17 ENCOUNTER — Other Ambulatory Visit: Payer: Self-pay | Admitting: Gastroenterology

## 2014-02-17 DIAGNOSIS — R1032 Left lower quadrant pain: Secondary | ICD-10-CM

## 2014-02-18 ENCOUNTER — Ambulatory Visit (HOSPITAL_COMMUNITY)
Admission: RE | Admit: 2014-02-18 | Discharge: 2014-02-18 | Disposition: A | Discharge status: Home / Self Care | Payer: BLUE CROSS/BLUE SHIELD | Source: Ambulatory Visit | Attending: Gastroenterology | Admitting: Gastroenterology

## 2014-02-18 DIAGNOSIS — R1032 Left lower quadrant pain: Secondary | ICD-10-CM | POA: Insufficient documentation

## 2014-02-18 MED ORDER — IOHEXOL 300 MG/ML  SOLN
100.0000 mL | Freq: Once | INTRAMUSCULAR | Status: AC | PRN
  Administered 2014-02-18: 100 mL via INTRAVENOUS

## 2014-11-16 ENCOUNTER — Ambulatory Visit (INDEPENDENT_AMBULATORY_CARE_PROVIDER_SITE_OTHER): Payer: BLUE CROSS/BLUE SHIELD | Admitting: Family Medicine

## 2014-11-16 VITALS — BP 130/84 | HR 81 | Temp 98.3°F | Resp 16 | Ht 68.0 in | Wt 196.0 lb

## 2014-11-16 DIAGNOSIS — J209 Acute bronchitis, unspecified: Secondary | ICD-10-CM

## 2014-11-16 MED ORDER — HYDROCODONE-HOMATROPINE 5-1.5 MG/5ML PO SYRP: 5.0000 mL | ORAL_SOLUTION | Freq: Three times a day (TID) | ORAL | Status: DC | PRN

## 2014-11-16 MED ORDER — AZITHROMYCIN 250 MG PO TABS: ORAL_TABLET | ORAL | Status: DC

## 2014-11-16 NOTE — Progress Notes (Signed)
Subjective:  This chart was scribed for Elvina Sidle MD, by Veverly Fells, at [facility].  This patient was seen in room 4 and the patient's care was started at 2:51 PM.    Patient ID: Joe Grant, male    DOB: 1965/09/11, 50 y.o.   MRN: [id number] Chief Complaint  Patient presents with   Cough    Onset 5 days   Nasal Congestion   Sinusitis    HPI  HPI Comments: Joe Grant is a 49 y.o. male who presents to the [facility] complaining of a cough and nasal congestion onset five days ago.  He has not yet taken any medication to alleviate his symptoms.  Patients wife had a respiratory infection last week and thinks that he may have gotten it from her. Patient does not have a history of asthma and does not smoke but chews tobacco.  Patient works at [employer] and [employer], operational.  Patient states that he walks about 15 miles per day.   Diverticulitis: Patients diverticulitis is all clear and states that he found out after he got his last colonoscopy (was told he didn't have to have one for another 10 years).  He states that he has changed his eating habits significantly.    There are no active problems to display for this patient.  Past Medical History  Diagnosis Date   Diverticulitis    No past surgical history on file. No Known Allergies Prior to Admission medications   Medication Sig Start Date End Date Taking? Authorizing Provider  Probiotic Product (PROBIOTIC DAILY PO) Take 1 tablet by mouth daily.     Historical Provider, MD   Social History   Social History   Marital Status: Married    Spouse Name: N/A   Number of Children: N/A   Years of Education: N/A   Occupational History   Not on file.   Social History Main Topics   Smoking status: Never Smoker    Smokeless tobacco: Current User    Types: Chew   Alcohol Use: 0.0 oz/week    0 Standard drinks or equivalent per week   Drug Use: No   Sexual Activity:  Not on file   Other Topics Concern   Not on file   Social History Narrative       Review of Systems  Constitutional: Negative for fever and chills.  HENT: Positive for congestion.   Eyes: Negative for pain, redness and itching.  Respiratory: Positive for cough.   Gastrointestinal: Negative for nausea and vomiting.  Musculoskeletal: Negative for neck pain and neck stiffness.       Objective:   Physical Exam  Constitutional: He appears well-developed and well-nourished. No distress.  HENT:  Head: Normocephalic and atraumatic.  Eyes: Pupils are equal, round, and reactive to light.  Cardiovascular: Normal rate.   Pulmonary/Chest: Effort normal. No respiratory distress.  Musculoskeletal: Normal range of motion.  Neurological: He is alert.  Skin: Skin is warm and dry.  Psychiatric: He has a normal mood and affect. His behavior is normal.  Nursing note and vitals reviewed.  few expiratory wheezes bilaterally No pedal edema Skin clear of rashes  Filed Vitals:   11/16/14 1442  BP: 130/84  Pulse: 81  Temp: 98.3 F (36.8 C)  TempSrc: Oral  Resp: 16  Height:  (1.727 m)  Weight: 196 lb (88.905 kg)  SpO2: 98%           Assessment & Plan:  This chart was scribed in my presence and reviewed by me personally.    ICD-9-CM ICD-10-CM   1. Acute bronchitis, unspecified organism 466.0 J20.9 azithromycin (ZITHROMAX) 250 MG tablet     HYDROcodone-homatropine (HYCODAN) 5-1.5 MG/5ML syrup     Signed, Elvina Sidle, MD

## 2014-11-16 NOTE — Patient Instructions (Signed)

## 2015-02-20 ENCOUNTER — Ambulatory Visit (INDEPENDENT_AMBULATORY_CARE_PROVIDER_SITE_OTHER): Payer: BLUE CROSS/BLUE SHIELD | Admitting: Family Medicine

## 2015-02-20 ENCOUNTER — Ambulatory Visit (INDEPENDENT_AMBULATORY_CARE_PROVIDER_SITE_OTHER): Payer: BLUE CROSS/BLUE SHIELD

## 2015-02-20 VITALS — BP 120/80 | HR 80 | Temp 97.6°F | Resp 16 | Ht 69.0 in | Wt 183.0 lb

## 2015-02-20 DIAGNOSIS — R059 Cough, unspecified: Secondary | ICD-10-CM

## 2015-02-20 DIAGNOSIS — J988 Other specified respiratory disorders: Secondary | ICD-10-CM

## 2015-02-20 DIAGNOSIS — Z8719 Personal history of other diseases of the digestive system: Secondary | ICD-10-CM | POA: Diagnosis not present

## 2015-02-20 DIAGNOSIS — R062 Wheezing: Secondary | ICD-10-CM

## 2015-02-20 DIAGNOSIS — R05 Cough: Secondary | ICD-10-CM | POA: Diagnosis not present

## 2015-02-20 DIAGNOSIS — J22 Unspecified acute lower respiratory infection: Secondary | ICD-10-CM

## 2015-02-20 MED ORDER — ALBUTEROL SULFATE (2.5 MG/3ML) 0.083% IN NEBU
2.5000 mg | INHALATION_SOLUTION | Freq: Once | RESPIRATORY_TRACT | Status: AC
  Administered 2015-02-20: 2.5 mg via RESPIRATORY_TRACT

## 2015-02-20 MED ORDER — IPRATROPIUM BROMIDE 0.02 % IN SOLN
0.5000 mg | Freq: Once | RESPIRATORY_TRACT | Status: AC
  Administered 2015-02-20: 0.5 mg via RESPIRATORY_TRACT

## 2015-02-20 MED ORDER — AZITHROMYCIN 250 MG PO TABS
ORAL_TABLET | ORAL | Status: DC
Start: 2015-02-20 — End: 2015-03-06

## 2015-02-20 MED ORDER — HYDROCODONE-HOMATROPINE 5-1.5 MG/5ML PO SYRP
5.0000 mL | ORAL_SOLUTION | Freq: Every evening | ORAL | Status: DC | PRN
Start: 2015-02-20 — End: 2017-01-27

## 2015-02-20 MED ORDER — ALBUTEROL SULFATE HFA 108 (90 BASE) MCG/ACT IN AERS: 2.0000 | INHALATION_SPRAY | Freq: Four times a day (QID) | RESPIRATORY_TRACT | Status: DC | PRN

## 2015-02-20 NOTE — Progress Notes (Deleted)
   Subjective:    Patient ID: Joe GobbleBryan Grant, male    DOB: 18-Feb-1965, 50 y.o.   MRN: [id number]  HPI    Review of Systems     Objective:   Physical Exam        Assessment & Plan:

## 2015-02-20 NOTE — Patient Instructions (Signed)
Acute Bronchitis Bronchitis is inflammation of the airways that extend from the windpipe into the lungs (bronchi). The inflammation often causes mucus to develop. This leads to a cough, which is the most common symptom of bronchitis.  In acute bronchitis, the condition usually develops suddenly and goes away over time, usually in a couple weeks. Smoking, allergies, and asthma can make bronchitis worse. Repeated episodes of bronchitis may cause further lung problems.  CAUSES Acute bronchitis is most often caused by the same virus that causes a cold. The virus can spread from person to person (contagious) through coughing, sneezing, and touching contaminated objects. SIGNS AND SYMPTOMS   Cough.   Fever.   Coughing up mucus.   Body aches.   Chest congestion.   Chills.   Shortness of breath.   Sore throat.  DIAGNOSIS  Acute bronchitis is usually diagnosed through a physical exam. Your health care provider will also ask you questions about your medical history. Tests, such as chest X-rays, are sometimes done to rule out other conditions.  TREATMENT  Acute bronchitis usually goes away in a couple weeks. Oftentimes, no medical treatment is necessary. Medicines are sometimes given for relief of fever or cough. Antibiotic medicines are usually not needed but may be prescribed in certain situations. In some cases, an inhaler may be recommended to help reduce shortness of breath and control the cough. A cool mist vaporizer may also be used to help thin bronchial secretions and make it easier to clear the chest.  HOME CARE INSTRUCTIONS  Get plenty of rest.   Drink enough fluids to keep your urine clear or pale yellow (unless you have a medical condition that requires fluid restriction). Increasing fluids may help thin your respiratory secretions (sputum) and reduce chest congestion, and it will prevent dehydration.   Take medicines only as directed by your health care provider.  If  you were prescribed an antibiotic medicine, finish it all even if you start to feel better.  Avoid smoking and secondhand smoke. Exposure to cigarette smoke or irritating chemicals will make bronchitis worse. If you are a smoker, consider using nicotine gum or skin patches to help control withdrawal symptoms. Quitting smoking will help your lungs heal faster.   Reduce the chances of another bout of acute bronchitis by washing your hands frequently, avoiding people with cold symptoms, and trying not to touch your hands to your mouth, nose, or eyes.   Keep all follow-up visits as directed by your health care provider.  SEEK MEDICAL CARE IF: Your symptoms do not improve after 1 week of treatment.  SEEK IMMEDIATE MEDICAL CARE IF:  You develop an increased fever or chills.   You have chest pain.   You have severe shortness of breath.  You have bloody sputum.   You develop dehydration.  You faint or repeatedly feel like you are going to pass out.  You develop repeated vomiting.  You develop a severe headache. MAKE SURE YOU:   Understand these instructions.  Will watch your condition.  Will get help right away if you are not doing well or get worse.   This information is not intended to replace advice given to you by your health care provider. Make sure you discuss any questions you have with your health care provider.   Document Released: 03/07/2004 Document Revised: 02/18/2014 Document Reviewed: 07/21/2012 Elsevier Interactive Patient Education 2016 Elsevier Inc.  

## 2015-02-20 NOTE — Progress Notes (Signed)
Chief Complaint:  Chief Complaint  Patient presents with  . Cough    x 2 days   . Nasal Congestion  . chest congestion    HPI: Joe Grant is a 50 y.o. male who [facility] complaining of coughing x 2 days, he feels like crap, he took alkaseltzer and flu and threw it up. He has diverticular disease. 1 week hx of nasal congestion. He is wheezing,  Associated with Nasal and chest congestion.  He denies any fevers or chills. The sxs started 1 week ago. 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   Past Medical History  Diagnosis Date  . Diverticulitis    History reviewed. No pertinent past surgical history. Social History   Social History  . Marital Status: Married    Spouse Name: N/A  . Number of Children: N/A  . Years of Education: N/A   Social History Main Topics  . Smoking status: Never Smoker   . Smokeless tobacco: Current User    Types: Chew  . Alcohol Use: 0.0 oz/week    0 Standard drinks or equivalent per week  . Drug Use: No  . Sexual Activity: Not Asked   Other Topics Concern  . None   Social History Narrative   History reviewed. No pertinent family history. No Known Allergies Prior to Admission medications   Medication Sig Start Date End Date Taking? Authorizing Provider  Probiotic Product (PROBIOTIC DAILY PO) Take 1 tablet by mouth daily.    Yes Historical Provider, MD     ROS: The patient denies fevers, chills, night sweats, unintentional weight loss, chest pain, palpitations,  dyspnea on exertion, nausea, vomiting, abdominal pain, dysuria, hematuria, melena, numbness, weakness, or tingling.   All other systems have been reviewed and were otherwise negative with the exception of those mentioned in the HPI and as above.    PHYSICAL EXAM: Filed Vitals:   02/20/15 1053  BP: 120/80  Pulse: 80  Temp: 97.6 F (36.4 C)   Resp: 16   Body mass index is 27.01 kg/(m^2).   General: Alert, no acute distress HEENT:  Normocephalic, atraumatic, oropharynx patent. EOMI, PERRLA Cardiovascular:  Regular rate and rhythm, no rubs murmurs or gallops.  No Carotid bruits, radial pulse intact. No pedal edema.  Respiratory: Clear to auscultation bilaterally.  No wheezes, rales, or + rhoncherous BS.  No cyanosis, no use of accessory musculature Abdominal: No organomegaly, abdomen is soft and non-tender, positive bowel sounds. No masses. Skin: No rashes. Neurologic: Facial musculature symmetric. Psychiatric: Patient acts appropriately throughout our interaction. Lymphatic: No cervical or submandibular lymphadenopathy Musculoskeletal: Gait intact. No edema, tenderness   LABS: Results for orders placed or performed in visit on 01/29/14  POCT CBC  Result Value Ref Range   WBC 8.5 4.6 - 10.2 K/uL   Lymph, poc 3.6 (A) 0.6 - 3.4   POC LYMPH PERCENT 41.8 10 - 50 %L   MID (cbc) 0.4 0 - 0.9   POC MID % 5.1 0 - 12 %M   POC Granulocyte 4.5 2 - 6.9   Granulocyte percent 53.1 37 - 80 %G   RBC 4.94 4.69 - 6.13 M/uL   Hemoglobin 15.7 14.1 - 18.1 g/dL   HCT, POC 40.947.4 81.143.5 - 53.7 %   MCV 95.7 80 - 97 fL   MCH, POC 31.8 (  A) 27 - 31.2 pg   MCHC 33.1 31.8 - 35.4 g/dL   RDW, POC 16.1 %   Platelet Count, POC 205 142 - 424 K/uL   MPV 8.1 0 - 99.8 fL  POCT UA - Microscopic Only  Result Value Ref Range   WBC, Ur, HPF, POC NEG    RBC, urine, microscopic 0-3    Bacteria, U Microscopic NEG    Mucus, UA NEG    Epithelial cells, urine per micros 0-1    Crystals, Ur, HPF, POC NEG    Casts, Ur, LPF, POC NEG    Yeast, UA NEG   POCT urinalysis dipstick  Result Value Ref Range   Color, UA AMBER    Clarity, UA CLEAR    Glucose, UA NEG    Bilirubin, UA NEG    Ketones, UA NEG    Spec Grav, UA 1.015    Blood, UA TRACE-INTACT    pH, UA 7.0    Protein, UA NEG    Urobilinogen, UA 0.2    Nitrite, UA NEG    Leukocytes, UA Negative       EKG/XRAY:   Primary read interpreted by Dr. Conley Rolls at [facility]. ? Right lower lob increase vasculalr markings vs infiltrate   ASSESSMENT/PLAN: Encounter Diagnoses  Name Primary?  . Wheezing   . Coughing   . Hx of diverticulitis of colon   . Lower respiratory infection (e.g., bronchitis, pneumonia, pneumonitis, pulmonitis) Yes   Rx azithromycin, albuterol and hycodan prn Work Note wgiven Improved with neb treatment x1 , better flow  Fu prn   Gross sideeffects, risk and benefits, and alternatives of medications d/w patient. Patient is aware that all medications have potential sideeffects and we are unable to predict every sideeffect or drug-drug interaction that may occur.  Hadassah Rana DO  02/20/2015 12:52 PM

## 2015-03-06 ENCOUNTER — Ambulatory Visit (INDEPENDENT_AMBULATORY_CARE_PROVIDER_SITE_OTHER): Payer: BLUE CROSS/BLUE SHIELD | Admitting: Family Medicine

## 2015-03-06 ENCOUNTER — Ambulatory Visit (INDEPENDENT_AMBULATORY_CARE_PROVIDER_SITE_OTHER): Payer: BLUE CROSS/BLUE SHIELD

## 2015-03-06 VITALS — BP 149/90 | HR 82 | Temp 99.3°F | Resp 18 | Ht 70.0 in | Wt 181.4 lb

## 2015-03-06 DIAGNOSIS — J209 Acute bronchitis, unspecified: Secondary | ICD-10-CM | POA: Diagnosis not present

## 2015-03-06 DIAGNOSIS — I4510 Unspecified right bundle-branch block: Secondary | ICD-10-CM

## 2015-03-06 DIAGNOSIS — R0789 Other chest pain: Secondary | ICD-10-CM

## 2015-03-06 DIAGNOSIS — R6889 Other general symptoms and signs: Secondary | ICD-10-CM

## 2015-03-06 DIAGNOSIS — M546 Pain in thoracic spine: Secondary | ICD-10-CM | POA: Diagnosis not present

## 2015-03-06 LAB — POCT CBC
GRANULOCYTE PERCENT: 75.7 % (ref 37–80)
HEMATOCRIT: 44.8 % (ref 43.5–53.7)
HEMOGLOBIN: 15 g/dL (ref 14.1–18.1)
Lymph, poc: 2.5 (ref 0.6–3.4)
MCH, POC: 30.9 pg (ref 27–31.2)
MCHC: 33.5 g/dL (ref 31.8–35.4)
MCV: 92.1 fL (ref 80–97)
MID (cbc): 0.7 (ref 0–0.9)
MPV: 7.8 fL (ref 0–99.8)
PLATELET COUNT, POC: 202 10*3/uL (ref 142–424)
POC GRANULOCYTE: 9.9 — AB (ref 2–6.9)
POC LYMPH PERCENT: 18.9 %L (ref 10–50)
POC MID %: 5.4 %M (ref 0–12)
RBC: 4.87 M/uL (ref 4.69–6.13)
RDW, POC: 12.9 %
WBC: 13.1 10*3/uL — AB (ref 4.6–10.2)

## 2015-03-06 LAB — POCT INFLUENZA A/B
INFLUENZA A, POC: NEGATIVE
INFLUENZA B, POC: NEGATIVE

## 2015-03-06 MED ORDER — DOXYCYCLINE HYCLATE 100 MG PO CAPS: 100.0000 mg | ORAL_CAPSULE | Freq: Two times a day (BID) | ORAL | Status: DC

## 2015-03-06 MED ORDER — ALBUTEROL SULFATE (2.5 MG/3ML) 0.083% IN NEBU
2.5000 mg | INHALATION_SOLUTION | Freq: Once | RESPIRATORY_TRACT | Status: AC
  Administered 2015-03-06: 2.5 mg via RESPIRATORY_TRACT

## 2015-03-06 NOTE — Progress Notes (Signed)
Patient ID: Joe Grant, male    DOB: 1965-03-05  Age: 50 y.o. MRN: [id number]  Chief Complaint  Patient presents with  . Shortness of Breath    x 1 day, chest pressure, radiates to back pain    Subjective:   50 year old man who was here 2 weeks ago with a bronchitis. He was treated with azithromycin and a albuterol inhaler. He did better, but the last couple days she is gotten ill again. He has been coughing some though not a lot. He has pain in his chest wall around his flank bilaterally. He is not wheezing as badly as he was wheezes some. He was not awake acute about with a cough. He has had a little low-grade fever. He has a bloated feeling. He does not smoke. He works 2 jobs, long hours, gets broken sleep. No definite exposure.  Current allergies, medications, problem list, past/family and social histories reviewed.  Objective:  BP 149/90 mmHg  Pulse 82  Temp(Src) 99.3 F (37.4 C)  Resp 18  Ht  (1.778 m)  Wt 181 lb 6.4 oz (82.283 kg)  BMI 26.03 kg/m2  SpO2 98%  PF 360 L/min  He looks like he doesn't feel well. He is sniffling. TMs are normal. Throat not erythematous. Neck supple without significant nodes. Minimal wheezes at the bases of the lungs. Heart regular without murmur. 02 saturation was good. Low fever noted.  Peak flow 360 with predicted 560.  After seeing the CBC decided to get an EKG and chest x-ray and given a neb treatment.  Chest x-ray normal  Repeat peak flow is up to 450 after the nebulizer  Assessment & Plan:   Assessment: 1. Flu-like symptoms   2. Chest wall pain   3. Chest fullness   4. Bilateral thoracic back pain   5. Acute bronchitis, unspecified organism   6. Right bundle branch block       Plan: Flulike illness will check the flu swab and a CBC  Results for orders placed or performed in visit on 03/06/15  POCT CBC  Result Value Ref Range   WBC 13.1 (A) 4.6 - 10.2 K/uL   Lymph, poc 2.5 0.6 - 3.4   POC LYMPH PERCENT 18.9 10  - 50 %L   MID (cbc) 0.7 0 - 0.9   POC MID % 5.4 0 - 12 %M   POC Granulocyte 9.9 (A) 2 - 6.9   Granulocyte percent 75.7 37 - 80 %G   RBC 4.87 4.69 - 6.13 M/uL   Hemoglobin 15.0 14.1 - 18.1 g/dL   HCT, POC 40.9 81.1 - 53.7 %   MCV 92.1 80 - 97 fL   MCH, POC 30.9 27 - 31.2 pg   MCHC 33.5 31.8 - 35.4 g/dL   RDW, POC 91.4 %   Platelet Count, POC 202 142 - 424 K/uL   MPV 7.8 0 - 99.8 fL  POCT Influenza A/B  Result Value Ref Range   Influenza A, POC Negative Negative   Influenza B, POC Negative Negative    Orders Placed This Encounter  Procedures  . DG Chest 2 View    Order Specific Question:  Reason for Exam (SYMPTOM  OR DIAGNOSIS REQUIRED)    Answer:  bilateral back pain, chest tightness    Order Specific Question:  Preferred imaging location?    Answer:  External  . POCT CBC  . POCT Influenza A/B  . EKG 12-Lead    Meds ordered this encounter  Medications  .  albuterol (PROVENTIL) (2.5 MG/3ML) 0.083% nebulizer solution 2.5 mg    Sig:   . doxycycline (VIBRAMYCIN) 100 MG capsule    Sig: Take 1 capsule (100 mg total) by mouth 2 (two) times daily.    Dispense:  20 capsule    Refill:  0         Patient Instructions  Because you received an x-ray today, you will receive an invoice from [facility]. Please contact [facility] at [phone] with questions or concerns regarding your invoice. Our billing staff will not be able to assist you with those questions.   Drink plenty of fluids and get enough rest  Take doxycycline 1 twice daily for lung infection versus bronchitis)  Continue using the albuterol inhaler 2 inhalations every 4-6 hours as directed  Return if worse      Return if symptoms worsen or fail to improve.   Victorian Gunn, MD 03/06/2015

## 2015-03-06 NOTE — Patient Instructions (Addendum)
Because you received an x-ray today, you will receive an invoice from [facility]. Please contact [facility] at [phone] with questions or concerns regarding your invoice. Our billing staff will not be able to assist you with those questions.   Drink plenty of fluids and get enough rest  Take doxycycline 1 twice daily for lung infection versus bronchitis)  Continue using the albuterol inhaler 2 inhalations every 4-6 hours as directed  Return if worse

## 2015-04-15 ENCOUNTER — Ambulatory Visit (INDEPENDENT_AMBULATORY_CARE_PROVIDER_SITE_OTHER): Payer: BLUE CROSS/BLUE SHIELD | Admitting: Internal Medicine

## 2015-04-15 VITALS — BP 130/80 | HR 78 | Temp 97.4°F | Resp 18 | Ht 68.0 in | Wt 174.5 lb

## 2015-04-15 DIAGNOSIS — J988 Other specified respiratory disorders: Secondary | ICD-10-CM

## 2015-04-15 DIAGNOSIS — J22 Unspecified acute lower respiratory infection: Secondary | ICD-10-CM

## 2015-04-15 MED ORDER — AZITHROMYCIN 500 MG PO TABS: 500.0000 mg | ORAL_TABLET | Freq: Every day | ORAL | Status: DC

## 2015-04-15 MED ORDER — PREDNISONE 20 MG PO TABS: ORAL_TABLET | ORAL | Status: DC

## 2015-04-15 NOTE — Progress Notes (Signed)
Subjective:  By signing my name below, I, Stann Oresung-Kai Tsai, attest that this documentation has been prepared under the direction and in the presence of Ellamae Siaobert Doolittle, MD. Electronically Signed: Stann Oresung-Kai Tsai, Scribe. 04/15/2015 , 2:13 PM .  Patient was seen in Room 10 .   Patient ID: Joe Grant, male    DOB: Jun 20, 1965, 50 y.o.   MRN: [id number] Chief Complaint  Patient presents with  . Nasal Congestion    since thursday   HPI Joe Grant is a 50 y.o. male [facility] congestion that started 2 days ago. He states that he's been having a hacking cough with a lot of phlegm. He noticed a sore throat today due to the coughs. He denies fever or chills.   He was previously here for bronchitis on 03/06/15, seen by Dr. Alwyn RenHopper. He was treated doxycycline with fast improvement.   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   There are no active problems to display for this patient.   Current outpatient prescriptions:  .  albuterol (PROVENTIL HFA;VENTOLIN HFA) 108 (90 Base) MCG/ACT inhaler, Inhale 2 puffs into the lungs every 6 (six) hours as needed for wheezing or shortness of breath., Disp: 1 Inhaler, Rfl: 0 .  Probiotic Product (PROBIOTIC DAILY PO), Take 1 tablet by mouth daily. , Disp: , Rfl:  .  azithromycin (ZITHROMAX) 500 MG tablet, Take 1 tablet (500 mg total) by mouth daily., Disp: 5 tablet, Rfl: 0 .  doxycycline (VIBRAMYCIN) 100 MG capsule, Take 1 capsule (100 mg total) by mouth 2 (two) times daily. (Patient not taking: Reported on 04/15/2015), Disp: 20 capsule, Rfl: 0 .  HYDROcodone-homatropine (HYCODAN) 5-1.5 MG/5ML syrup, Take 5 mLs by mouth at bedtime as needed. (Patient not taking: Reported on 03/06/2015), Disp: 120 mL, Rfl: 0  Review of Systems  Constitutional: Positive for fatigue. Negative for fever, chills and activity change.  HENT: Positive for congestion, rhinorrhea, sore throat and voice change. Negative for sinus pressure.   Respiratory:  Positive for cough.   Gastrointestinal: Negative for nausea, vomiting, abdominal pain and diarrhea.      Objective:   Physical Exam  Constitutional: He is oriented to person, place, and time. He appears well-developed and well-nourished. No distress.  HENT:  Head: Normocephalic and atraumatic.  Eyes: EOM are normal. Pupils are equal, round, and reactive to light.  Neck: Neck supple.  Cardiovascular: Normal rate.   Pulmonary/Chest: Effort normal. No respiratory distress. He has wheezes.  Crackles at left base posteriorally, and wheezing with forced expiration bilaterally  Musculoskeletal: Normal range of motion.  Neurological: He is alert and oriented to person, place, and time.  Skin: Skin is warm and dry.  Psychiatric: He has a normal mood and affect. His behavior is normal.  Nursing note and vitals reviewed.   BP 130/80 mmHg  Pulse 78  Temp(Src) 97.4 F (36.3 C) (Oral)  Resp 18  Ht 5\' 8"  (1.727 m)  Wt 174 lb 8 oz (79.153 kg)  BMI 26.54 kg/m2  SpO2 96%    Assessment & Plan:  LLL infection with RAD Meds ordered this encounter  Medications  . azithromycin (ZITHROMAX) 500 MG tablet    Sig: Take 1 tablet (500 mg total) by mouth daily.    Dispense:  5 tablet    Refill:  0  . predniSONE (DELTASONE) 20 MG tablet    Sig: 3/3/2/2/1/1 single daily dose for 6 days    Dispense:  12 tablet    Refill:  0   I have completed the patient encounter in its entirety as documented by the scribe, with editing by me where necessary. Robert P. Merla Riches, M.D.

## 2015-08-08 IMAGING — CT CT ABD-PELV W/ CM
2 of 5 series · 10 of 46 positions shown, 11 images · IV contrast (Iodine)
Comparison: 09/03/2012

CLINICAL DATA: Initial encounter for left lower quadrant pain.

EXAM:
CT ABDOMEN AND PELVIS WITH CONTRAST
TECHNIQUE: Multidetector CT imaging of the abdomen and pelvis was performed
using the standard protocol following bolus administration of
intravenous contrast.
CONTRAST:  100mL OMNIPAQUE IOHEXOL 300 MG/ML  SOLN

[Series 201: routine, idose (2) · axial · 0.78mm/px · z∈[-494,-124]mm · 7 of 98 slices shown, 8 images]
[im 12/98  soft-tissue]
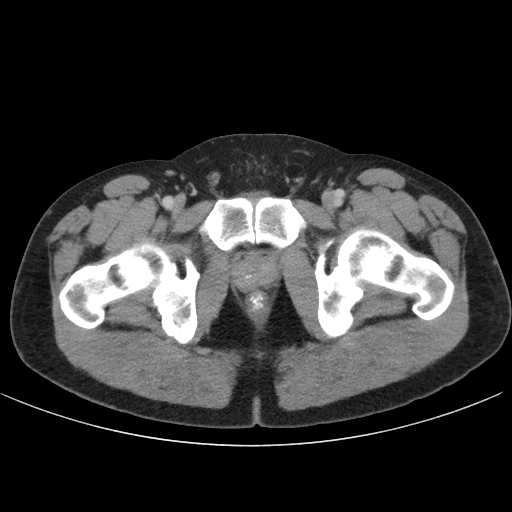
[im 12/98  bone]
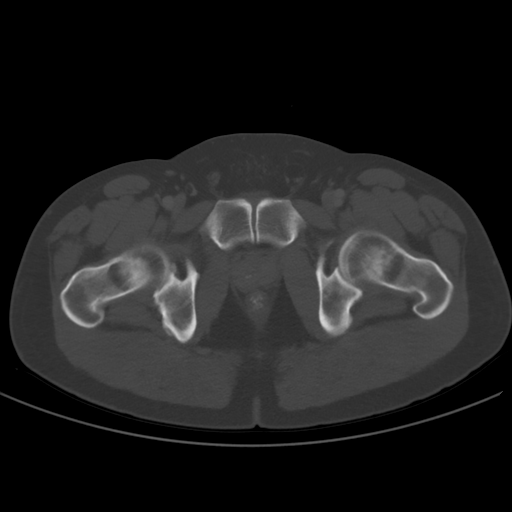
[im 23/98  soft-tissue]
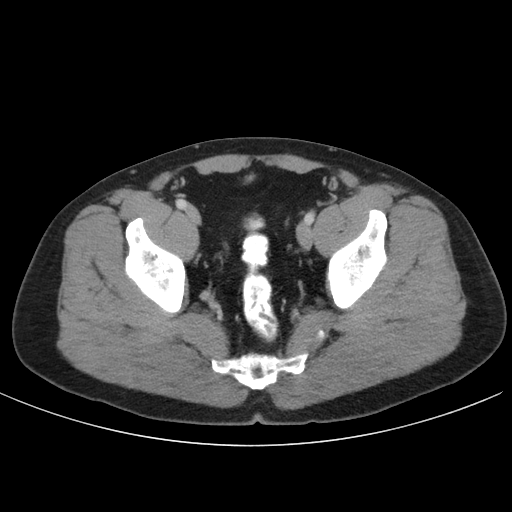
[im 35/98  soft-tissue]
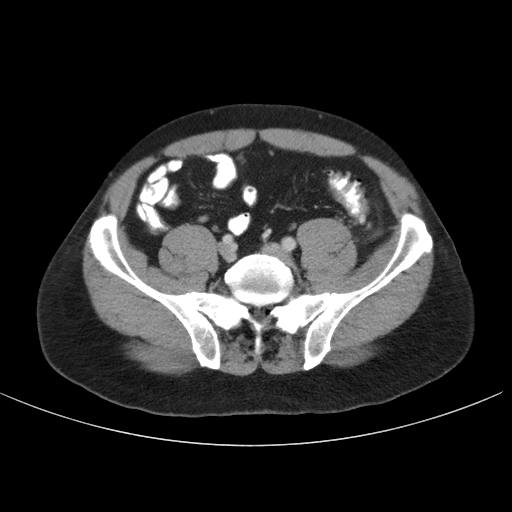
[im 52/98  soft-tissue]
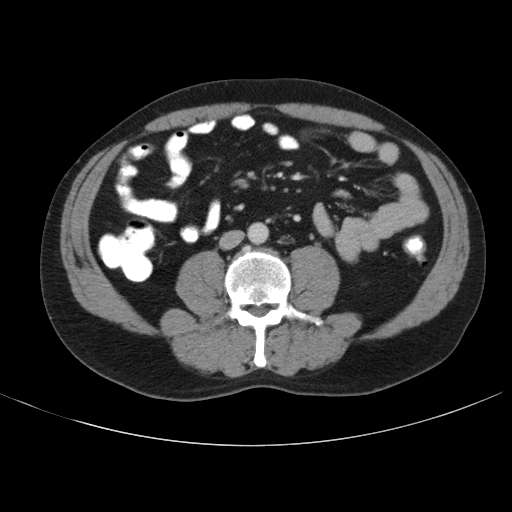
[im 63/98  soft-tissue]
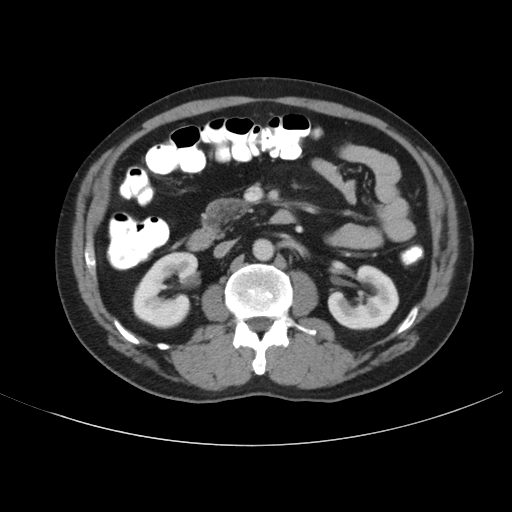
[im 75/98  soft-tissue]
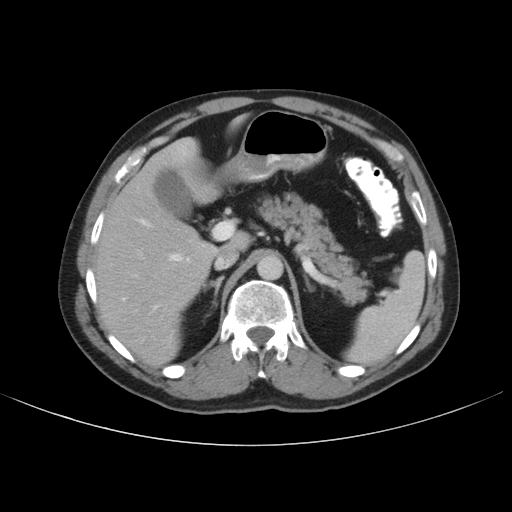
[im 86/98  soft-tissue]
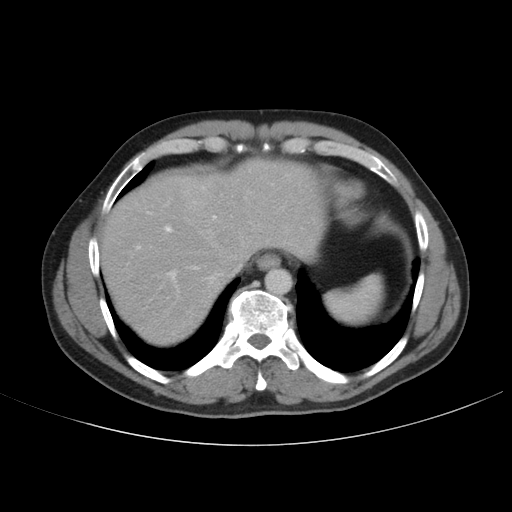

[Series 202: coronals, idose (2) · coronal · 0.45mm/px · 3 of 114 slices shown]
[im 38/114  soft-tissue]
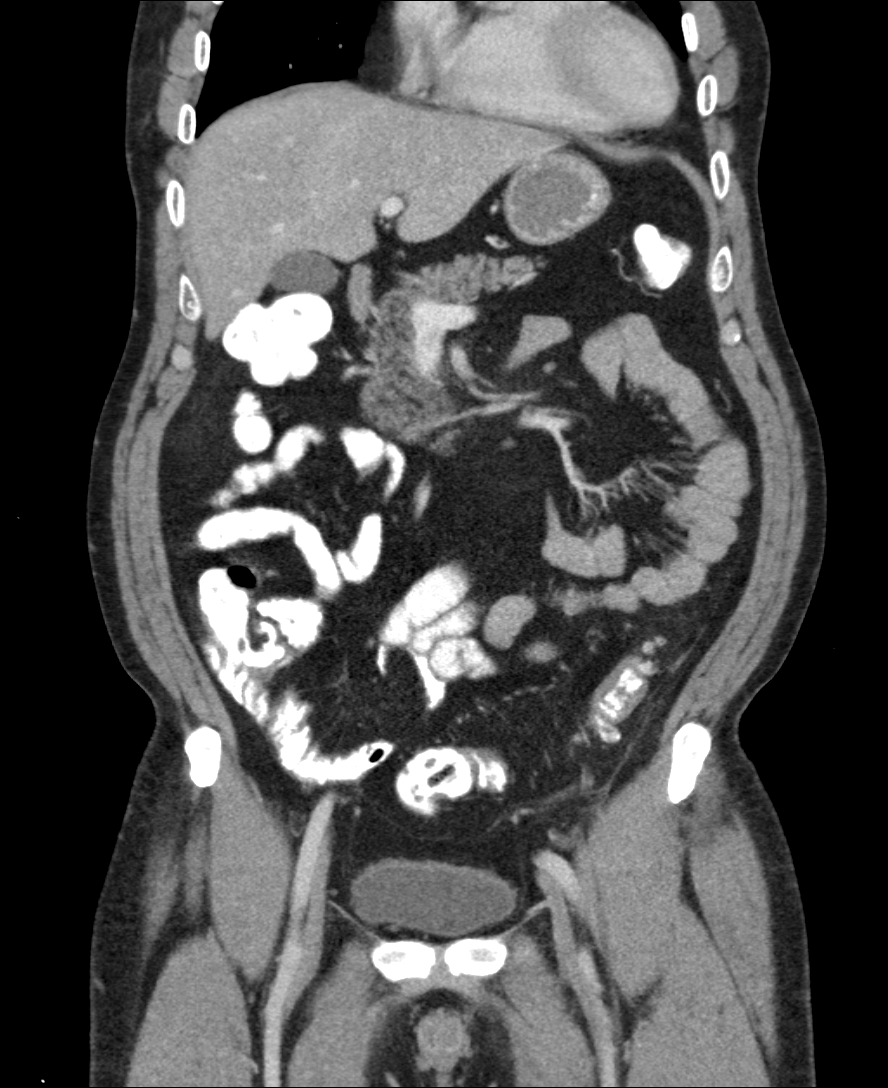
[im 51/114  soft-tissue]
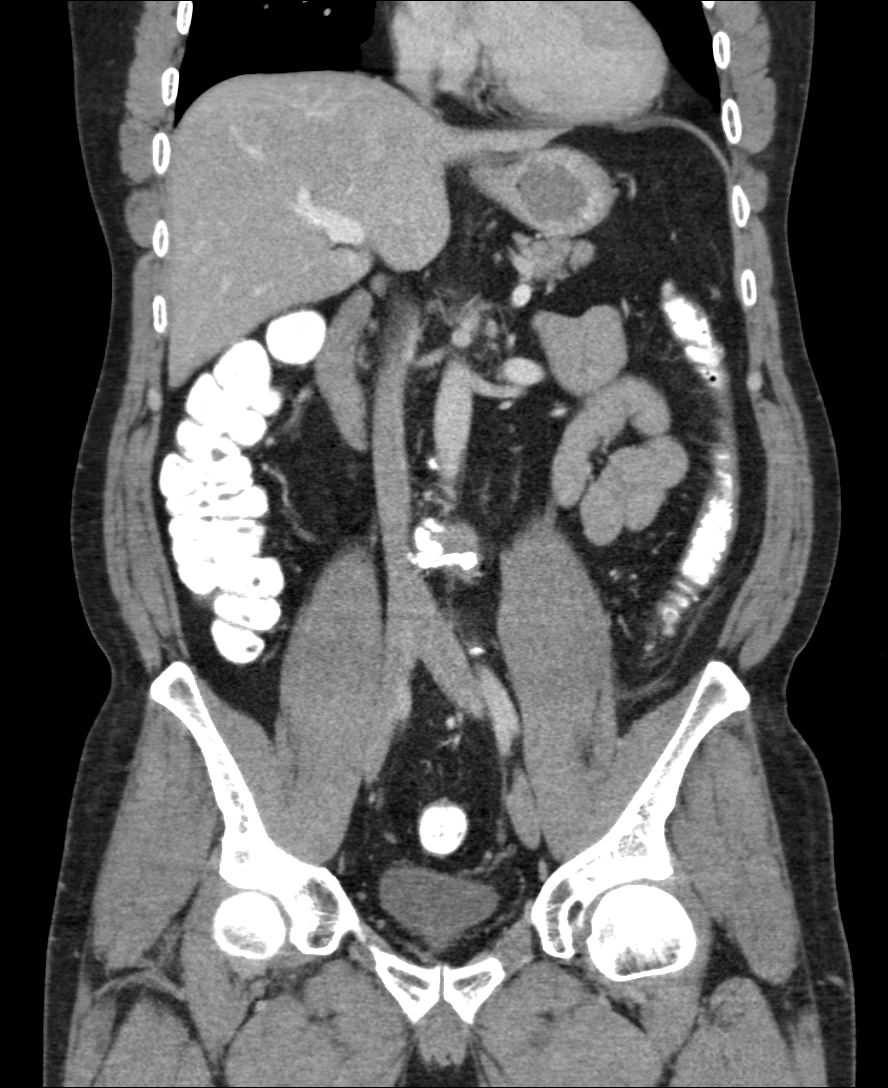
[im 63/114  soft-tissue]
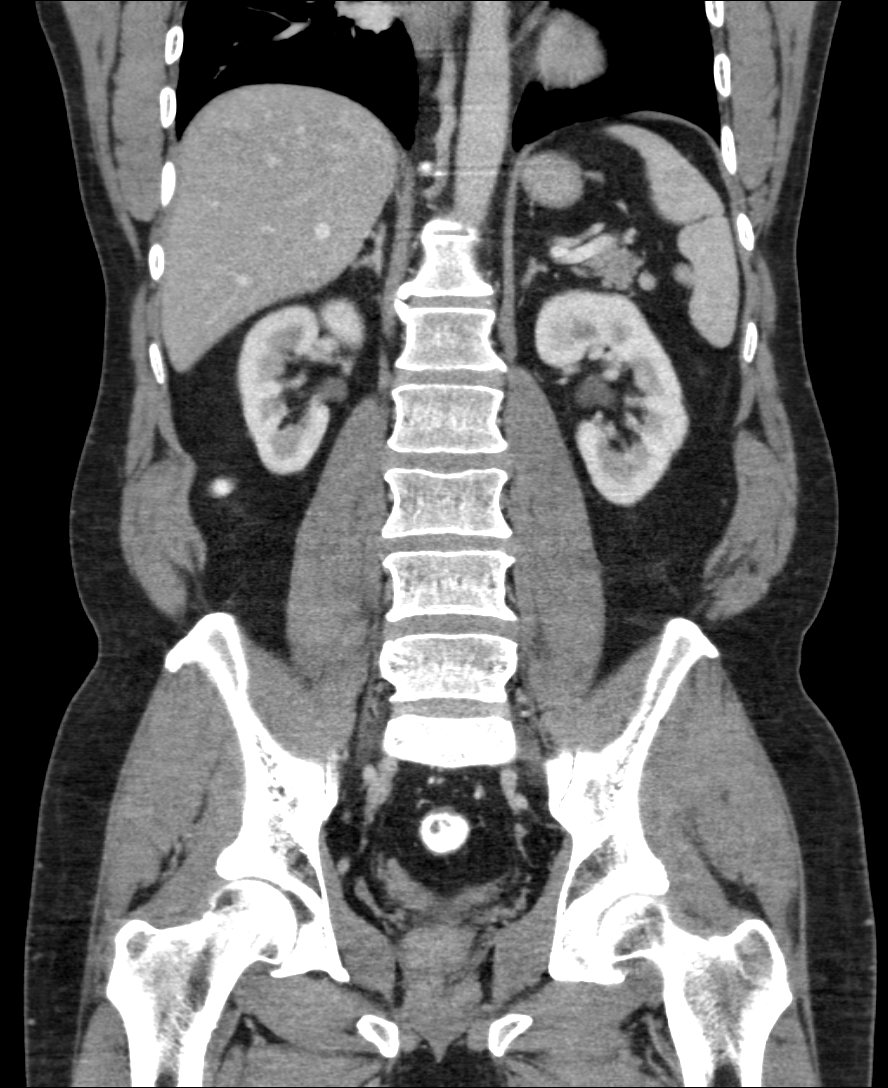

[10 of 46 positions shown; findings below may reference images not displayed]

FINDINGS: Lower chest: The lungs are clear without focal infiltrate, edema,
pneumothorax or pleural effusion.

Hepatobiliary: Variable attenuation of the liver parenchyma is
consistent with geographic steatosis. There is no evidence for
gallstones, gallbladder wall thickening, or pericholecystic fluid.
No intrahepatic or extrahepatic biliary dilation.

Pancreas: No focal mass lesion. No dilatation of the main duct. No
intraparenchymal cyst. No peripancreatic edema.

Spleen: No splenomegaly. No focal mass lesion.

Adrenals/Urinary Tract: No adrenal nodule or mass. Kidneys are
unremarkable. No hydronephrosis or hydroureter bladder has normal CT
imaging features.

Stomach/Bowel: Stomach is nondistended. No gastric wall thickening.
No evidence of outlet obstruction. Duodenum is normally positioned
as is the ligament of Treitz. No small bowel wall thickening. No
small bowel dilatation. Terminal ileum and appendix are normal.
Diverticular changes are seen in the left colon. There is a short
segment of asymmetric wall thickening in the distal descending colon
with adjacent edema/ inflammation. No evidence for extraluminal gas
or associated abscess.

Vascular/Lymphatic: No abdominal aortic aneurysm. No abdominal
lymphadenopathy. No evidence for pelvic lymphadenopathy.

Reproductive: Prostate gland and seminal vesicles are unremarkable
in appearance.

Other: No intraperitoneal free fluid.

Musculoskeletal: Bone windows reveal no worrisome lytic or sclerotic
osseous lesions.
IMPRESSION: Short segment of asymmetric wall thickening in the distal descending
colon with adjacent edema/ inflammation. Imaging features are most
consistent with diverticulitis. No perforation or abscess. Neoplasm
can present with similar imaging features so close followup is
recommended.

## 2016-04-26 ENCOUNTER — Other Ambulatory Visit: Payer: Self-pay

## 2016-04-26 DIAGNOSIS — R062 Wheezing: Secondary | ICD-10-CM

## 2016-04-26 DIAGNOSIS — R059 Cough, unspecified: Secondary | ICD-10-CM

## 2016-04-26 DIAGNOSIS — J22 Unspecified acute lower respiratory infection: Secondary | ICD-10-CM

## 2016-04-26 DIAGNOSIS — Z8719 Personal history of other diseases of the digestive system: Secondary | ICD-10-CM

## 2016-04-26 DIAGNOSIS — R05 Cough: Secondary | ICD-10-CM

## 2016-04-26 MED ORDER — ALBUTEROL SULFATE HFA 108 (90 BASE) MCG/ACT IN AERS: 2.0000 | INHALATION_SPRAY | Freq: Four times a day (QID) | RESPIRATORY_TRACT | 0 refills | Status: DC | PRN

## 2016-04-26 NOTE — Telephone Encounter (Signed)
Fax req [facility] Dr. Terald SleeperVentolin HFA inhaler Pt has not been seen since 04/2015. Refilled x 1 with -0- RF and note to return to clinic to establish with PCP - Dr. Merla Richesoolittle has retired.

## 2017-01-23 ENCOUNTER — Encounter: Payer: Self-pay | Admitting: Family Medicine

## 2017-01-23 ENCOUNTER — Ambulatory Visit: Payer: BLUE CROSS/BLUE SHIELD | Admitting: Family Medicine

## 2017-01-23 ENCOUNTER — Other Ambulatory Visit: Payer: Self-pay

## 2017-01-23 VITALS — BP 101/58 | HR 92 | Temp 98.9°F | Wt 165.0 lb

## 2017-01-23 DIAGNOSIS — R197 Diarrhea, unspecified: Secondary | ICD-10-CM

## 2017-01-23 DIAGNOSIS — R1013 Epigastric pain: Secondary | ICD-10-CM | POA: Diagnosis not present

## 2017-01-23 DIAGNOSIS — K5732 Diverticulitis of large intestine without perforation or abscess without bleeding: Secondary | ICD-10-CM | POA: Diagnosis not present

## 2017-01-23 LAB — POCT CBC
Granulocyte percent: 85.4 %G — AB (ref 37–80)
HCT, POC: 47.7 % (ref 43.5–53.7)
Hemoglobin: 15.6 g/dL (ref 14.1–18.1)
Lymph, poc: 1.2 (ref 0.6–3.4)
MCH, POC: 31.4 pg — AB (ref 27–31.2)
MCHC: 32.8 g/dL (ref 31.8–35.4)
MCV: 95.8 fL (ref 80–97)
MID (cbc): 0.3 (ref 0–0.9)
MPV: 8.1 fL (ref 0–99.8)
POC Granulocyte: 8.6 — AB (ref 2–6.9)
POC LYMPH PERCENT: 11.4 %L (ref 10–50)
POC MID %: 3.2 %M (ref 0–12)
Platelet Count, POC: 220 10*3/uL (ref 142–424)
RBC: 4.97 M/uL (ref 4.69–6.13)
RDW, POC: 12.6 %
WBC: 10.1 10*3/uL (ref 4.6–10.2)

## 2017-01-23 MED ORDER — METRONIDAZOLE 500 MG PO TABS: 500.0000 mg | ORAL_TABLET | Freq: Three times a day (TID) | ORAL | 0 refills | Status: DC

## 2017-01-23 MED ORDER — RANITIDINE HCL 150 MG PO TABS: 150.0000 mg | ORAL_TABLET | Freq: Two times a day (BID) | ORAL | 0 refills | Status: DC

## 2017-01-23 NOTE — Patient Instructions (Signed)
     IF you received an x-ray today, you will receive an invoice from [facility]. Please contact [facility] at [phone] with questions or concerns regarding your invoice.   IF you received labwork today, you will receive an invoice from [facility]. Please contact [facility] at [phone] with questions or concerns regarding your invoice.   Our billing staff will not be able to assist you with questions regarding bills from these companies.  You will be contacted with the lab results as soon as they are available. The fastest way to get your results is to activate your My Chart account. Instructions are located on the last page of this paperwork. If you have not heard from us regarding the results in 2 weeks, please contact this office.     

## 2017-01-23 NOTE — Progress Notes (Signed)
12/13/20185:44 PM  [facility] 1966/01/04, 51 y.o. [facility] [id number]  Chief Complaint  Patient presents with  . Diverticulitis    DX 4 YRS AGO HAS PAIN AND NASEA WITH VOMITING    HPI:   Patient is a 51 y.o. male with past medical history significant for diverticulitis confirmed per CT who presents today for almost a week of abd pain started LLQ now more epigastric, bloating, nausea and vomiting x 1. No blood. Endorses diarrhea, no blood, no fever, maybe some chills. Tried peptobismol once and it did not help. Has been following a bland diet.   [facility] 2[facility] 01/23/2017 04/15/2015 03/06/2015  Decreased Interest 0 0 0  Down, Depressed, Hopeless 0 0 0  PHQ - 2 Score 0 0 0    No Known Allergies  Prior to Admission medications   Medication Sig Start Date End Date Taking? Authorizing Provider  Probiotic Product (PROBIOTIC DAILY PO) Take 1 tablet by mouth daily.    Yes [provider]    Past Medical History:  Diagnosis Date  . Diverticulitis     History reviewed. No pertinent surgical history.  Social History   Tobacco Use  . Smoking status: Never Smoker  . Smokeless tobacco: Current User    Types: Chew  Substance Use Topics  . Alcohol use: Yes    Alcohol/week: 0.0 oz    History reviewed. No pertinent family history.  ROS Per hpi  OBJECTIVE:  Blood pressure (!) 101/58, pulse 92, temperature 98.9 F (37.2 C), temperature source Oral, weight 165 lb (74.8 kg), SpO2 97 %.  BP Readings from Last 3 Encounters:  01/23/17 (!) 101/58  04/15/15 130/80  03/06/15 (!) 149/90    Physical Exam  Constitutional: He is oriented to person, place, and time and well-developed, well-nourished, and in no distress.  HENT:  Head: Normocephalic and atraumatic.  Mouth/Throat: Oropharynx is clear and moist.  Eyes: EOM are normal. Pupils are equal, round, and reactive to light.  Neck: Neck supple.  Cardiovascular: Normal rate and regular rhythm. Exam reveals  no gallop and no friction rub.  No murmur heard. Pulmonary/Chest: Effort normal and breath sounds normal. He has no wheezes. He has no rales.  Abdominal: Soft. Bowel sounds are normal. He exhibits no distension. There is no hepatosplenomegaly. There is tenderness in the epigastric area and left lower quadrant. There is no rebound and no guarding.  Neurological: He is alert and oriented to person, place, and time. Gait normal.  Skin: Skin is warm and dry.     Results for orders placed or performed in visit on 01/23/17 (from the past 24 hour(s))  POCT CBC     Status: Abnormal   Collection Time: 01/23/17  6:19 PM  Result Value Ref Range   WBC 10.1 4.6 - 10.2 K/uL   Lymph, poc 1.2 0.6 - 3.4   POC LYMPH PERCENT 11.4 10 - 50 %L   MID (cbc) 0.3 0 - 0.9   POC MID % 3.2 0 - 12 %M   POC Granulocyte 8.6 (A) 2 - 6.9   Granulocyte percent 85.4 (A) 37 - 80 %G   RBC 4.97 4.69 - 6.13 M/uL   Hemoglobin 15.6 14.1 - 18.1 g/dL   HCT, POC 40.947.7 81.143.5 - 53.7 %   MCV 95.8 80 - 97 fL   MCH, POC 31.4 (A) 27 - 31.2 pg   MCHC 32.8 31.8 - 35.4 g/dL   RDW, POC 91.412.6 %   Platelet Count, POC 220 142 -  424 K/uL   MPV 8.1 0 - 99.8 fL      ASSESSMENT and PLAN 1. Diverticulitis of colon Discussed supportive measures, new meds r/se/b and RTC precautions.   2. Abdominal pain, epigastric - POCT CBC - Comprehensive metabolic panel  3. Diarrhea, unspecified type - POCT CBC - Comprehensive metabolic panel  Other orders - metroNIDAZOLE (FLAGYL) 500 MG tablet; Take 1 tablet (500 mg total) by mouth 3 (three) times daily. - ranitidine (ZANTAC) 150 MG tablet; Take 1 tablet (150 mg total) by mouth 2 (two) times daily.  Return in about 2 weeks (around 02/06/2017).    Joe LippsIrma M Santiago, MD [facility] [address] [place], [place] [address] Ph.  [phone] Fax [phone]

## 2017-01-24 LAB — COMPREHENSIVE METABOLIC PANEL
ALT: 37 IU/L (ref 0–44)
AST: 31 IU/L (ref 0–40)
Albumin/Globulin Ratio: 1.6 (ref 1.2–2.2)
Albumin: 4.5 g/dL (ref 3.5–5.5)
Alkaline Phosphatase: 60 IU/L (ref 39–117)
BUN/Creatinine Ratio: 26 — ABNORMAL HIGH (ref 9–20)
BUN: 22 mg/dL (ref 6–24)
Bilirubin Total: 0.6 mg/dL (ref 0.0–1.2)
CO2: 27 mmol/L (ref 20–29)
Calcium: 9.1 mg/dL (ref 8.7–10.2)
Chloride: 105 mmol/L (ref 96–106)
Creatinine, Ser: 0.86 mg/dL (ref 0.76–1.27)
GFR calc Af Amer: 117 mL/min/{1.73_m2} (ref >59)
GFR calc non Af Amer: 101 mL/min/{1.73_m2} (ref >59)
Globulin, Total: 2.9 g/dL (ref 1.5–4.5)
Glucose: 79 mg/dL (ref 65–99)
Potassium: 4.7 mmol/L (ref 3.5–5.2)
Sodium: 141 mmol/L (ref 134–144)
Total Protein: 7.4 g/dL (ref 6.0–8.5)

## 2017-01-27 ENCOUNTER — Encounter: Payer: Self-pay | Admitting: Family Medicine

## 2017-02-06 ENCOUNTER — Other Ambulatory Visit: Payer: Self-pay

## 2017-02-06 ENCOUNTER — Ambulatory Visit (INDEPENDENT_AMBULATORY_CARE_PROVIDER_SITE_OTHER): Payer: BLUE CROSS/BLUE SHIELD | Admitting: Family Medicine

## 2017-02-06 ENCOUNTER — Encounter: Payer: Self-pay | Admitting: Family Medicine

## 2017-02-06 VITALS — BP 108/68 | HR 87 | Temp 98.0°F | Resp 16 | Ht 70.47 in | Wt 163.0 lb

## 2017-02-06 DIAGNOSIS — G44319 Acute post-traumatic headache, not intractable: Secondary | ICD-10-CM

## 2017-02-06 DIAGNOSIS — M549 Dorsalgia, unspecified: Secondary | ICD-10-CM

## 2017-02-06 MED ORDER — TRAMADOL HCL 50 MG PO TABS: 50.0000 mg | ORAL_TABLET | Freq: Three times a day (TID) | ORAL | 0 refills | Status: DC | PRN

## 2017-02-06 MED ORDER — NAPROXEN SODIUM 550 MG PO TABS: 550.0000 mg | ORAL_TABLET | Freq: Two times a day (BID) | ORAL | 0 refills | Status: AC

## 2017-02-06 MED ORDER — CYCLOBENZAPRINE HCL 5 MG PO TABS: 10.0000 mg | ORAL_TABLET | Freq: Three times a day (TID) | ORAL | 0 refills | Status: DC | PRN
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
## 2017-02-19 ENCOUNTER — Other Ambulatory Visit: Payer: Self-pay | Admitting: Family Medicine

## 2019-05-22 ENCOUNTER — Ambulatory Visit: Payer: BC Managed Care – PPO | Attending: Internal Medicine

## 2019-05-22 DIAGNOSIS — Z23 Encounter for immunization: Secondary | ICD-10-CM

## 2019-05-22 NOTE — Progress Notes (Signed)
   Covid-19 Vaccination Clinic  Name:  Joe Grant    MRN: [id number] DOB: 1966-01-17  05/22/2019  [name] was observed post Covid-19 immunization for 15 minutes without incident. He was provided with Vaccine Information Sheet and instruction to access the V-Safe system.   [name] was instructed to call 911 with any severe reactions post vaccine: Marland Kitchen Difficulty breathing  . Swelling of face and throat  . A fast heartbeat  . A bad rash all over body  . Dizziness and weakness   Immunizations Administered    Name Date Dose VIS Date Route   Pfizer COVID-19 Vaccine 05/22/2019 11:49 AM 0.3 mL 01/22/2019 Intramuscular   Manufacturer: [employer], [employer]   Lot: [id number]   NDC: [address]-7

## 2019-06-14 ENCOUNTER — Ambulatory Visit: Payer: BC Managed Care – PPO | Attending: Internal Medicine

## 2019-06-14 DIAGNOSIS — Z23 Encounter for immunization: Secondary | ICD-10-CM

## 2019-06-14 NOTE — Progress Notes (Signed)
   Covid-19 Vaccination Clinic  Name:  Esiah Bazinet    MRN: [id number] DOB: Jun 13, 1965  06/14/2019  Mr. Jeffus was observed post Covid-19 immunization for 15 minutes without incident. He was provided with Vaccine Information Sheet and instruction to access the V-Safe system.   Mr. Dorer was instructed to call 911 with any severe reactions post vaccine: Marland Kitchen Difficulty breathing  . Swelling of face and throat  . A fast heartbeat  . A bad rash all over body  . Dizziness and weakness   Immunizations Administered    Name Date Dose VIS Date Route   Pfizer COVID-19 Vaccine 06/14/2019  2:10 PM 0.3 mL 04/07/2018 Intramuscular   Manufacturer: [employer], [employer]   Lot: [id number]   NDC: [address]-5      Covid-19 Vaccination Clinic  Name:  Jandiel Magallanes    MRN: [id number] DOB: 1966/01/05  06/14/2019  Mr. Oberhaus was observed post Covid-19 immunization for 15 minutes without incident. He was provided with Vaccine Information Sheet and instruction to access the V-Safe system.   Mr. Dolberry was instructed to call 911 with any severe reactions post vaccine: Marland Kitchen Difficulty breathing  . Swelling of face and throat  . A fast heartbeat  . A bad rash all over body  . Dizziness and weakness   Immunizations Administered    Name Date Dose VIS Date Route   Pfizer COVID-19 Vaccine 06/14/2019  2:10 PM 0.3 mL 04/07/2018 Intramuscular   Manufacturer: [employer], [employer]   Lot: [id number]   NDC: [address]-6

## 2019-10-04 ENCOUNTER — Other Ambulatory Visit: Payer: Self-pay

## 2019-10-04 ENCOUNTER — Encounter: Payer: Self-pay | Admitting: Emergency Medicine

## 2019-10-04 ENCOUNTER — Ambulatory Visit
Admission: EM | Admit: 2019-10-04 | Discharge: 2019-10-04 | Disposition: A | Discharge status: Home / Self Care | Payer: BC Managed Care – PPO | Attending: Emergency Medicine | Admitting: Emergency Medicine

## 2019-10-04 DIAGNOSIS — R0981 Nasal congestion: Secondary | ICD-10-CM | POA: Diagnosis not present

## 2019-10-04 DIAGNOSIS — Z1152 Encounter for screening for COVID-19: Secondary | ICD-10-CM | POA: Diagnosis not present

## 2019-10-04 NOTE — ED Triage Notes (Signed)
Pt here for nasal congestion x 4 days; pt requests covid test

## 2019-10-04 NOTE — Discharge Instructions (Addendum)
Your COVID test is pending - it is important to quarantine / isolate at home until your results are back. °If you test positive and would like further evaluation for persistent or worsening symptoms, you may schedule an E-visit or virtual (video) visit throughout the [facility] MyChart app or website. ° °PLEASE NOTE: If you develop severe chest pain or shortness of breath please go to the ER or call 9-1-1 for further evaluation --> DO NOT schedule electronic or virtual visits for this. °Please call our office for further guidance / recommendations as needed. ° °For information about the Covid vaccine, please visit [email or web address] °

## 2019-10-04 NOTE — ED Provider Notes (Signed)
[facility]    CSN: [id number] Arrival date & time: 10/04/19  1310      History   Chief Complaint Chief Complaint  Patient presents with  . Nasal Congestion    HPI Joe Grant is a 54 y.o. male   Presenting for nasal congestion x4 days.  He has tried an allergy pill without relief.  Has not used Flonase.  No known Covid contacts, presents today.  Works for [employer].  Denies cough, difficulty breathing, chest pain.  Past Medical History:  Diagnosis Date  . Diverticulitis     There are no problems to display for this patient.   History reviewed. No pertinent surgical history.     Home Medications    Prior to Admission medications   Medication Sig Start Date End Date Taking? Authorizing Provider  naproxen sodium (ANAPROX) 550 MG tablet Take 1 tablet (550 mg total) by mouth 2 (two) times daily with a meal. 02/06/17   Myles Lipps, MD  Probiotic Product (PROBIOTIC DAILY PO) Take 1 tablet by mouth daily.     [provider]  ranitidine (ZANTAC) 150 MG tablet TAKE 1 TABLET BY MOUTH TWICE A DAY 02/19/17   Myles Lipps, MD    Family History History reviewed. No pertinent family history.  Social History Social History   Tobacco Use  . Smoking status: Never Smoker  . Smokeless tobacco: Current User    Types: Chew  Substance Use Topics  . Alcohol use: Yes    Alcohol/week: 0.0 standard drinks  . Drug use: No     Allergies   Patient has no known allergies.   Review of Systems As per HPI   Physical Exam Triage Vital Signs ED Triage Vitals  Enc Vitals Group     BP 10/04/19 1324 128/86     Pulse Rate 10/04/19 1324 (!) 103     Resp 10/04/19 1324 18     Temp 10/04/19 1324 98.8 F (37.1 C)     Temp Source 10/04/19 1324 Oral     SpO2 10/04/19 1324 95 %     Weight --      Height --      Head Circumference --      Peak Flow --      Pain Score 10/04/19 1325 3     Pain Loc --      Pain Edu? --      Excl. in GC? --    No data  found.  Updated Vital Signs BP 128/86 (BP Location: Right Arm)   Pulse (!) 103   Temp 98.8 F (37.1 C) (Oral)   Resp 18   SpO2 95%   Visual Acuity Right Eye Distance:   Left Eye Distance:   Bilateral Distance:    Right Eye Near:   Left Eye Near:    Bilateral Near:     Physical Exam Constitutional:      General: He is not in acute distress. HENT:     Head: Normocephalic and atraumatic.  Eyes:     General: No scleral icterus.    Pupils: Pupils are equal, round, and reactive to light.  Cardiovascular:     Rate and Rhythm: Normal rate.  Pulmonary:     Effort: Pulmonary effort is normal. No respiratory distress.     Breath sounds: No wheezing.  Skin:    Coloration: Skin is not jaundiced or pale.  Neurological:     Mental Status: He is alert and oriented to person,  place, and time.      UC Treatments / Results  Labs (all labs ordered are listed, but only abnormal results are displayed) Labs Reviewed  NOVEL CORONAVIRUS, NAA    EKG   Radiology No results found.  Procedures Procedures (including critical care time)  Medications Ordered in UC Medications - No data to display  Initial Impression / Assessment and Plan / UC Course  I have reviewed the triage vital signs and the nursing notes.  Pertinent labs & imaging results that were available during my care of the patient were reviewed by me and considered in my medical decision making (see chart for details).     Patient afebrile, nontoxic, with SpO2 95%.  Covid PCR pending.  Patient to quarantine until results are back.  We will treat supportively as outlined below.  Return precautions discussed, patient verbalized understanding and is agreeable to plan. Final Clinical Impressions(s) / UC Diagnoses   Final diagnoses:  Encounter for screening for COVID-19  Nasal congestion     Discharge Instructions     Your COVID test is pending - it is important to quarantine / isolate at home until your results  are back. If you test positive and would like further evaluation for persistent or worsening symptoms, you may schedule an E-visit or virtual (video) visit throughout the [facility] app or website.  PLEASE NOTE: If you develop severe chest pain or shortness of breath please go to the ER or call [phone] for further evaluation --> DO NOT schedule electronic or virtual visits for this. Please call our office for further guidance / recommendations as needed.  For information about the Covid vaccine, please visit [email or web address]    ED Prescriptions    None     PDMP not reviewed this encounter.   [place], [place], [place] 10/04/19 1347

## 2019-10-05 LAB — NOVEL CORONAVIRUS, NAA: SARS-CoV-2, NAA: NOT DETECTED

## 2019-10-05 LAB — SARS-COV-2, NAA 2 DAY TAT
